# Patient Record
Sex: Male | Born: 1955
Health system: Southern US, Community
[De-identification: ages and names within clinical notes are randomized; demographics above are authoritative.]

## PROBLEM LIST (undated history)

## (undated) DIAGNOSIS — M1990 Unspecified osteoarthritis, unspecified site: Secondary | ICD-10-CM

## (undated) DIAGNOSIS — Z973 Presence of spectacles and contact lenses: Secondary | ICD-10-CM

## (undated) DIAGNOSIS — E119 Type 2 diabetes mellitus without complications: Secondary | ICD-10-CM

## (undated) DIAGNOSIS — K219 Gastro-esophageal reflux disease without esophagitis: Secondary | ICD-10-CM

## (undated) DIAGNOSIS — J3089 Other allergic rhinitis: Secondary | ICD-10-CM

## (undated) DIAGNOSIS — C61 Malignant neoplasm of prostate: Secondary | ICD-10-CM

## (undated) DIAGNOSIS — Z85820 Personal history of malignant melanoma of skin: Secondary | ICD-10-CM

## (undated) DIAGNOSIS — H409 Unspecified glaucoma: Secondary | ICD-10-CM

## (undated) DIAGNOSIS — F32A Depression, unspecified: Secondary | ICD-10-CM

## (undated) DIAGNOSIS — Z9889 Other specified postprocedural states: Secondary | ICD-10-CM

## (undated) DIAGNOSIS — Z9189 Other specified personal risk factors, not elsewhere classified: Secondary | ICD-10-CM

## (undated) DIAGNOSIS — F329 Major depressive disorder, single episode, unspecified: Secondary | ICD-10-CM

## (undated) DIAGNOSIS — I10 Essential (primary) hypertension: Secondary | ICD-10-CM

## (undated) HISTORY — PX: CARPAL TUNNEL RELEASE: SHX101

## (undated) HISTORY — DX: Unspecified osteoarthritis, unspecified site: M19.90

## (undated) HISTORY — DX: Major depressive disorder, single episode, unspecified: F32.9

## (undated) HISTORY — PX: PROSTATE BIOPSY: SHX241

## (undated) HISTORY — DX: Unspecified glaucoma: H40.9

## (undated) HISTORY — DX: Malignant neoplasm of prostate: C61

## (undated) HISTORY — DX: Depression, unspecified: F32.A

## (undated) HISTORY — PX: KNEE ARTHROSCOPY: SUR90

---

## 1989-08-21 HISTORY — PX: EYE SURGERY: SHX253

## 1998-09-14 ENCOUNTER — Ambulatory Visit (HOSPITAL_COMMUNITY): Admission: RE | Admit: 1998-09-14 | Discharge: 1998-09-14 | Payer: Self-pay | Admitting: Emergency Medicine

## 1999-09-09 ENCOUNTER — Encounter: Payer: Self-pay | Admitting: Emergency Medicine

## 1999-09-10 ENCOUNTER — Inpatient Hospital Stay (HOSPITAL_COMMUNITY): Admission: EM | Admit: 1999-09-10 | Discharge: 1999-09-12 | Payer: Self-pay | Admitting: Emergency Medicine

## 1999-09-10 ENCOUNTER — Encounter: Payer: Self-pay | Admitting: Emergency Medicine

## 1999-09-10 ENCOUNTER — Encounter: Payer: Self-pay | Admitting: *Deleted

## 1999-10-08 ENCOUNTER — Ambulatory Visit (HOSPITAL_COMMUNITY): Admission: RE | Admit: 1999-10-08 | Discharge: 1999-10-08 | Payer: Self-pay | Admitting: Gastroenterology

## 1999-10-08 ENCOUNTER — Encounter: Payer: Self-pay | Admitting: Gastroenterology

## 1999-10-22 ENCOUNTER — Ambulatory Visit (HOSPITAL_COMMUNITY): Admission: RE | Admit: 1999-10-22 | Discharge: 1999-10-22 | Payer: Self-pay | Admitting: Gastroenterology

## 1999-10-22 ENCOUNTER — Encounter: Payer: Self-pay | Admitting: Gastroenterology

## 2002-07-10 ENCOUNTER — Ambulatory Visit (HOSPITAL_COMMUNITY): Admission: RE | Admit: 2002-07-10 | Discharge: 2002-07-10 | Payer: Self-pay | Admitting: Gastroenterology

## 2002-07-10 ENCOUNTER — Encounter (INDEPENDENT_AMBULATORY_CARE_PROVIDER_SITE_OTHER): Payer: Self-pay | Admitting: *Deleted

## 2005-08-15 ENCOUNTER — Emergency Department (HOSPITAL_COMMUNITY): Admission: EM | Admit: 2005-08-15 | Discharge: 2005-08-15 | Payer: Self-pay | Admitting: Emergency Medicine

## 2005-09-17 ENCOUNTER — Emergency Department (HOSPITAL_COMMUNITY): Admission: EM | Admit: 2005-09-17 | Discharge: 2005-09-17 | Payer: Self-pay | Admitting: Emergency Medicine

## 2006-09-06 ENCOUNTER — Ambulatory Visit (HOSPITAL_BASED_OUTPATIENT_CLINIC_OR_DEPARTMENT_OTHER): Admission: RE | Admit: 2006-09-06 | Discharge: 2006-09-06 | Payer: Self-pay | Admitting: Orthopedic Surgery

## 2006-09-06 HISTORY — PX: OTHER SURGICAL HISTORY: SHX169

## 2008-02-08 ENCOUNTER — Emergency Department (HOSPITAL_COMMUNITY): Admission: EM | Admit: 2008-02-08 | Discharge: 2008-02-08 | Payer: Self-pay | Admitting: Emergency Medicine

## 2010-09-19 ENCOUNTER — Ambulatory Visit: Payer: Self-pay | Admitting: Genetic Counselor

## 2011-05-08 NOTE — Op Note (Signed)
Elijah Roberts, Elijah Roberts                ACCOUNT NO.:  [id number]   MEDICAL RECORD NO.:  [id number]          PATIENT TYPE:  AMB   LOCATION:  DSC                          FACILITY:  [facility]   PHYSICIAN:  Leonides Grills, M.D.     DATE OF BIRTH:  12-21-1956   DATE OF PROCEDURE:  09/06/2006  DATE OF DISCHARGE:                                 OPERATIVE REPORT   PREOPERATIVE DIAGNOSIS:  Right closed fifth metatarsal fracture.   POSTOPERATIVE DIAGNOSIS:  Right closed fifth metatarsal fracture.   OPERATION:  Open reduction internal fixation, right fifth metatarsal  fracture.   ANESTHESIA:  General.   SURGEON:  Leonides Grills, M.D.   ASSISTANT:  Evlyn Kanner, PA-C   ESTIMATED BLOOD LOSS:  Minimal.   TOURNIQUET TIME:  Approximately one-half hour.   COMPLICATIONS:  None.   DISPOSITION:  Stable to PR.   INDICATIONS FOR PROCEDURE:  This is a 55 year old male who sustained the  above injuries, consented to the above procedure.  All risks including  infection, neurovascular injury, nonunion, malunion, heart irritation, heart  failure, cock-up toe deformity of fifth toe secondary to painful scar  formation were all explained.  Questions were encouraged and answered.   OPERATION:  Patient brought to the operating room placed in the supine  position initially.  After adequate general endotracheal tube anesthesia was  administered as well as Ancef 1 g IV piggyback.  A bump was placed at the  ipsilateral hip, internally rotating the right lower extremity, and the  right lower extremity was prepped and draped in the sterile manor with  approximately 5 tourniquet limbs gravity exsanguinated and then tourniquet  was elevated to 290 mmHg.  A longitudinal incision over the dorsolateral  aspect of the right fifth metatarsal was then made.  Dissection was carried  down to bone.  Hemostasis was obtained.  Fracture was anatomically aligned  with the 2-point reduction clamp and 2 set screws were placed, one is  a 2.0-  mm screw with a 1.5-mm drill and the other screw is a 2.7-mm screw with a  2.0-mm drill hole.  This was counter sunk.  Both had excellent purchase and  maintenance of the correction.  Tourniquets were deflated.  Hemostasis was  obtained.  Stress x-rays were obtained, AP, lateral and oblique plains, that  showed no gross motion at the fracture site.  Fixation and proper position  and anatomical  alignment as well.  Hemostasis was obtained.  The wound was copiously  irrigated with normal saline.  Subcutaneous was closed with 3-0 Vicryl.  Skin was closed with 4-0 nylon.  Sterile dressing was applied.  A CAM Walker  boot was supplied to patient.  The patient was stable to the PR.      Leonides Grills, M.D.  Electronically Signed     PB/MEDQ  D:  09/06/2006  T:  09/07/2006  Job:  [id number]

## 2014-12-21 HISTORY — PX: CATARACT EXTRACTION W/ INTRAOCULAR LENS  IMPLANT, BILATERAL: SHX1307

## 2015-11-20 ENCOUNTER — Encounter: Payer: Self-pay | Admitting: Radiation Oncology

## 2015-11-20 DIAGNOSIS — C61 Malignant neoplasm of prostate: Secondary | ICD-10-CM

## 2015-11-20 NOTE — Progress Notes (Signed)
GU Location of Tumor / Histology: prostatic adenocarcinoma  If Prostate Cancer, Gleason Score is (3 + 4) and PSA is (4.32) on 09/18/2015  Celene Kras presented to Dr. Karsten Ro reporting his PSA was 3 several years ago.   Biopsies of prostate (if applicable) revealed:    Past/Anticipated interventions by urology, if any: biopsy and referral to Dr. Tammi Klippel to discuss radioactive seed implantation  Past/Anticipated interventions by medical oncology, if any: no  Weight changes, if any: no  Bowel/Bladder complaints, if any: Nocturia x 1 if at all. Reports nocturia is dependent solely on fluid intake. Reports occasional urinary frequency solely related to fluid intake as well.   Nausea/Vomiting, if any: no  Pain issues, if any:  Reports taking Motrin for "ambulance back"  SAFETY ISSUES:  Prior radiation? no  Pacemaker/ICD? no  Possible current pregnancy? no  Is the patient on methotrexate? 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

## 2015-11-21 ENCOUNTER — Ambulatory Visit
Admission: RE | Admit: 2015-11-21 | Discharge: 2015-11-21 | Disposition: A | Discharge status: Home / Self Care | Payer: 59 | Source: Ambulatory Visit | Attending: Radiation Oncology | Admitting: Radiation Oncology

## 2015-11-21 ENCOUNTER — Encounter: Payer: Self-pay | Admitting: Radiation Oncology

## 2015-11-21 VITALS — BP 147/91 | HR 79 | Resp 16 | Ht 73.0 in | Wt 263.4 lb

## 2015-11-21 DIAGNOSIS — C61 Malignant neoplasm of prostate: Secondary | ICD-10-CM | POA: Insufficient documentation

## 2015-11-21 DIAGNOSIS — Z510 Encounter for antineoplastic radiation therapy: Secondary | ICD-10-CM | POA: Insufficient documentation

## 2015-11-21 NOTE — Progress Notes (Signed)
See progress note under physician encounter. 

## 2015-11-21 NOTE — Progress Notes (Signed)
Radiation Oncology         [phone]) [phone] ________________________________  Initial outpatient Consultation  Name: Elijah Roberts MRN: [id number]  Date: 11/21/2015  DOB: Jul 27, 1956  CC:No primary care provider on file.  Kathie Rhodes, MD   REFERRING PHYSICIAN: Kathie Rhodes, MD  DIAGNOSIS: 59 y.o. gentleman with stage T1c adenocarcinoma of the prostate with a Gleason's score of 3+4 and a PSA of 4.32    ICD-9-CM ICD-10-CM   1. Malignant neoplasm of prostate ([place]) [place] [facility] referral to [place] is a 59 y.o. gentleman.  He was noted to have an elevated PSA of 4.32 by his primary care physician, Dr. Bing Matter.  Accordingly, he was referred for evaluation in urology by Dr. Karsten Ro on 09/25/15,  digital rectal examination was performed at that time revealing no nodules.  The patient proceeded to transrectal ultrasound with 12 biopsies of the prostate on 10/25/15.  The prostate volume measured 30.5 cc.  Out of 12 core biopsies,2 were positive.  The maximum Gleason score was 3+4, and this was seen in left lateral mid.  The patient reviewed the biopsy results with his urologist and he has kindly been referred today for discussion of potential radiation treatment options.    Marland Kitchen  PREVIOUS RADIATION THERAPY: No  PAST MEDICAL HISTORY:  has a past medical history of Prostate cancer ([place]); Arthritis; Depression; Glaucoma; and Skin cancer.    PAST SURGICAL HISTORY: Past Surgical History  Procedure Laterality Date  . Knee surgery    . Knee surgery    . Wrist surgery    . Prostate biopsy      FAMILY HISTORY: family history includes Cancer in his maternal uncle.  SOCIAL HISTORY:  reports that he has never smoked. He has never used smokeless tobacco. He reports that he does not drink alcohol or use illicit drugs.  ALLERGIES: Altace; Bee venom; Other; and Tetanus toxoids  MEDICATIONS:  Current Outpatient Prescriptions  Medication Sig  Dispense Refill  . aspirin 81 MG tablet Take 81 mg by mouth daily.    . bimatoprost (LUMIGAN) 0.03 % ophthalmic solution 1 drop at bedtime.    . dorzolamide-timolol (COSOPT) 22.3-6.8 MG/ML ophthalmic solution 1 drop 2 (two) times daily.    Marland Kitchen EPIPEN 2-PAK 0.3 MG/0.3ML SOAJ injection   11  . ibuprofen (ADVIL,MOTRIN) 200 MG tablet Take 200 mg by mouth every 6 (six) hours as needed.    . metFORMIN (GLUCOPHAGE) 500 MG tablet Take by mouth 2 (two) times daily with a meal.     No current facility-administered medications for this encounter.    REVIEW OF SYSTEMS:  A 15 point review of systems is documented in the electronic medical record. This was obtained by the nursing staff. However, I reviewed this with the patient to discuss relevant findings and make appropriate changes.  A comprehensive review of systems was negative..  The patient completed an IPSS and IIEF questionnaire.  His IPSS score was 2 indicating mild urinary outflow obstructive symptoms.  He indicated that his erectile function is able to complete sexual activity almost always.   PHYSICAL EXAM: This patient is in no acute distress.  He is alert and oriented.   height is 6\' 1"  (1.854 m) and weight is 263 lb 6.4 oz (119.477 kg). His blood pressure is 147/91 and his pulse is 79. His respiration is 16 and oxygen saturation is 100%.  He exhibits no respiratory distress or labored breathing.  He  appears neurologically intact.  His mood is pleasant.  His affect is appropriate.  Please note the digital rectal exam findings described above.  KPS = 100  100 - Normal; no complaints; no evidence of disease. 90   - Able to carry on normal activity; minor signs or symptoms of disease. 80   - Normal activity with effort; some signs or symptoms of disease. 97   - Cares for self; unable to carry on normal activity or to do active work. 60   - Requires occasional assistance, but is able to care for most of his personal needs. 50   - Requires  considerable assistance and frequent medical care. 56   - Disabled; requires special care and assistance. 7   - Severely disabled; hospital admission is indicated although death not imminent. 57   - Very sick; hospital admission necessary; active supportive treatment necessary. 10   - Moribund; fatal processes progressing rapidly. 0     - Dead  Karnofsky DA, Abelmann WH, Craver LS and Burchenal JH [phone]) The use of the nitrogen mustards in the palliative treatment of carcinoma: with particular reference to bronchogenic carcinoma Cancer 1 634-56   LABORATORY DATA:  No results found for: WBC, HGB, HCT, MCV, PLT No results found for: NA, K, CL, CO2 No results found for: ALT, AST, GGT, ALKPHOS, BILITOT   RADIOGRAPHY: No results found.    IMPRESSION: This gentleman is a 59 y.o. gentleman with stage T1c adenocarcinoma of the prostate with a Gleason's score of 3+4 and a PSA of 4.32.  His T-Stage, Gleason's Score, and PSA put him into the intermediate risk group.  He falls into a select sub-set of patients with intermediate risk disease who are eligible for seed implant with primary Gleason grade of 3 and less than half of one lobe positive for Gleason's 7 disease.  Accordingly he is eligible for a variety of potential treatment options including seed implant.  PLAN:Today I reviewed the findings and workup thus far.  We discussed the natural history of prostate cancer.  We reviewed the the implications of T-stage, Gleason's Score, and PSA on decision-making and outcomes in prostate cancer.  We discussed radiation treatment in the management of prostate cancer with regard to the logistics and delivery of external beam radiation treatment as well as the logistics and delivery of prostate brachytherapy.  We compared and contrasted each of these approaches and also compared these against prostatectomy.  The patient expressed interest in prostate brachytherapy.  I filled out a patient counseling form for him  with relevant treatment diagrams and we retained a copy for our records.   The patient would like to proceed with prostate brachytherapy.  I will share my findings with Dr. Karsten Ro and move forward with scheduling the procedure in the near future.     I enjoyed meeting with him today, and will look forward to participating in the care of this very nice gentleman.  I spent 55 minutes face to face with the patient and more than 50% of that time was spent in counseling and/or coordination of care.   ------------------------------------------------  Sheral Apley. Tammi Klippel, M.D.      This document serves as a record of services personally performed by Tyler Pita, MD. It was created on his behalf by Lendon Collar, a trained medical scribe. The creation of this record is based on the scribe's personal observations and the provider's statements to them. This document has been checked and approved by the attending provider.

## 2015-11-26 ENCOUNTER — Telehealth: Payer: Self-pay | Admitting: *Deleted

## 2015-11-26 ENCOUNTER — Other Ambulatory Visit: Payer: Self-pay | Admitting: Urology

## 2015-11-26 NOTE — Telephone Encounter (Signed)
CALLED PATIENT TO INFORM OF IMPLANT FOR 01-16-15, SPOKE WITH PATIENT AND HE IS AWARE OF THIS IMPLANT

## 2015-11-29 ENCOUNTER — Encounter: Payer: Self-pay | Admitting: *Deleted

## 2015-11-29 NOTE — Progress Notes (Signed)
[place] Psychosocial Distress Screening Clinical Social Work  Clinical Social Work was referred by distress screening protocol.  The patient scored a 7 on the Psychosocial Distress Thermometer which indicates severe distress. Clinical Social Worker phoned pt and reviewed chart to assess for distress and other psychosocial needs. Pt reports his current distress is not high and it was all related to work at that time as they were understaffed. Pt denies concerns about cancer treatment plan and was open to Pt and Family Support Team options and may attend future Prostate Cancer Support Groups. Pt agrees to reach out to [place] team as needs arise.   ONCBCN DISTRESS SCREENING 11/21/2015  Screening Type Initial Screening  Distress experienced in past week (1-10) 7  Practical problem type Work/school  Physician notified of physical symptoms Yes  Referral to clinical psychology No  Referral to clinical social work Yes  Referral to dietition No  Referral to financial advocate No  Referral to support programs No  Referral to palliative care No  Other [phone]    Clinical Social Worker follow up needed: No.  If yes, follow up plan: Loren Racer, [place]  [facility] Phone: [phone] Fax: [phone]

## 2015-12-02 ENCOUNTER — Ambulatory Visit: Payer: 59 | Admitting: Radiation Oncology

## 2015-12-02 ENCOUNTER — Ambulatory Visit: Admission: RE | Admit: 2015-12-02 | Payer: 59 | Source: Ambulatory Visit

## 2015-12-05 ENCOUNTER — Telehealth: Payer: Self-pay | Admitting: *Deleted

## 2015-12-05 NOTE — Telephone Encounter (Signed)
Called patient to remind of pre-seed appt. For 12-06-15, spoke with patient and he is aware of this appt.

## 2015-12-06 ENCOUNTER — Ambulatory Visit
Admission: RE | Admit: 2015-12-06 | Discharge: 2015-12-06 | Disposition: A | Discharge status: Home / Self Care | Payer: 59 | Source: Ambulatory Visit | Attending: Radiation Oncology | Admitting: Radiation Oncology

## 2015-12-06 ENCOUNTER — Ambulatory Visit (HOSPITAL_COMMUNITY)
Admission: RE | Admit: 2015-12-06 | Discharge: 2015-12-06 | Disposition: A | Discharge status: Home / Self Care | Payer: 59 | Source: Ambulatory Visit | Attending: Urology | Admitting: Urology

## 2015-12-06 ENCOUNTER — Ambulatory Visit (HOSPITAL_BASED_OUTPATIENT_CLINIC_OR_DEPARTMENT_OTHER)
Admission: RE | Admit: 2015-12-06 | Discharge: 2015-12-06 | Disposition: A | Discharge status: Home / Self Care | Payer: 59 | Source: Ambulatory Visit | Attending: Radiation Oncology | Admitting: Radiation Oncology

## 2015-12-06 ENCOUNTER — Encounter (HOSPITAL_BASED_OUTPATIENT_CLINIC_OR_DEPARTMENT_OTHER)
Admission: RE | Admit: 2015-12-06 | Discharge: 2015-12-06 | Disposition: A | Discharge status: Home / Self Care | Payer: 59 | Source: Ambulatory Visit | Attending: Urology | Admitting: Urology

## 2015-12-06 ENCOUNTER — Other Ambulatory Visit: Payer: Self-pay

## 2015-12-06 DIAGNOSIS — R918 Other nonspecific abnormal finding of lung field: Secondary | ICD-10-CM | POA: Insufficient documentation

## 2015-12-06 DIAGNOSIS — C61 Malignant neoplasm of prostate: Secondary | ICD-10-CM

## 2015-12-06 DIAGNOSIS — Z510 Encounter for antineoplastic radiation therapy: Secondary | ICD-10-CM | POA: Diagnosis not present

## 2015-12-06 DIAGNOSIS — Z01818 Encounter for other preprocedural examination: Secondary | ICD-10-CM | POA: Diagnosis not present

## 2015-12-06 NOTE — Progress Notes (Signed)
  Radiation Oncology         [phone]) [phone] ________________________________  Name: Elijah Roberts MRN: [id number]  Date: 12/06/2015  DOB: Apr 07, 1956  SIMULATION AND TREATMENT PLANNING NOTE PUBIC ARCH STUDY  CC:No primary care provider on file.  Kathie Rhodes, MD  DIAGNOSIS: 59 y.o. gentleman with stage T1c adenocarcinoma of the prostate with a Gleason's score of 3+4 and a PSA of 4.32     ICD-9-CM ICD-10-CM   1. Malignant neoplasm of prostate ([place]) [place]:  The patient presented today for evaluation for possible prostate seed implant. He was brought to the radiation planning suite and placed supine on the CT couch. A 3-dimensional image study set was obtained in upload to the planning computer. There, on each axial slice, I contoured the prostate gland. Then, using three-dimensional radiation planning tools I reconstructed the prostate in view of the structures from the transperineal needle pathway to assess for possible pubic arch interference. In doing so, I did not appreciate any pubic arch interference. Also, the patient's prostate volume was estimated based on the drawn structure. The volume was 36 cc.  Given the pubic arch appearance and prostate volume, patient remains a good candidate to proceed with prostate seed implant. Today, he freely provided informed written consent to proceed.    PLAN: The patient will undergo prostate seed implant.   ________________________________  Sheral Apley. Tammi Klippel, M.D.     This document serves as a record of services personally performed by Tyler Pita, MD. It was created on his behalf by Lendon Collar, a trained medical scribe. The creation of this record is based on the scribe's personal observations and the provider's statements to them. This document has been checked and approved by the attending provider.

## 2015-12-09 ENCOUNTER — Other Ambulatory Visit (HOSPITAL_COMMUNITY): Payer: Self-pay | Admitting: Urology

## 2015-12-09 DIAGNOSIS — C61 Malignant neoplasm of prostate: Secondary | ICD-10-CM

## 2015-12-12 ENCOUNTER — Encounter (HOSPITAL_COMMUNITY)
Admission: RE | Admit: 2015-12-12 | Discharge: 2015-12-12 | Disposition: A | Discharge status: Home / Self Care | Payer: 59 | Source: Ambulatory Visit | Attending: Urology | Admitting: Urology

## 2015-12-12 DIAGNOSIS — M899 Disorder of bone, unspecified: Secondary | ICD-10-CM | POA: Insufficient documentation

## 2015-12-12 DIAGNOSIS — C61 Malignant neoplasm of prostate: Secondary | ICD-10-CM

## 2015-12-12 MED ORDER — TECHNETIUM TC 99M MEDRONATE IV KIT
26.3000 | PACK | Freq: Once | INTRAVENOUS | Status: AC | PRN
  Administered 2015-12-12: 26.3 via INTRAVENOUS

## 2015-12-24 MED FILL — DORZOLAMIDE-TIMOLOL EYE DRP: 22.3-6.8 | 50 days supply | Qty: 10 | Fill #7

## 2016-01-08 ENCOUNTER — Encounter (HOSPITAL_BASED_OUTPATIENT_CLINIC_OR_DEPARTMENT_OTHER): Payer: Self-pay | Admitting: *Deleted

## 2016-01-09 ENCOUNTER — Telehealth: Payer: Self-pay | Admitting: *Deleted

## 2016-01-09 ENCOUNTER — Encounter (HOSPITAL_BASED_OUTPATIENT_CLINIC_OR_DEPARTMENT_OTHER): Payer: Self-pay | Admitting: *Deleted

## 2016-01-09 NOTE — Telephone Encounter (Signed)
CALLED PATIENT TO REMIND OF LABS FOR 01-10-16 FOR IMPLANT ON 01-17-16, SPOKE WITH PATIENT AND HE IS AWARE OF THESE LABS

## 2016-01-09 NOTE — Progress Notes (Signed)
NPO AFTER MN.  ARRIVE AT 0800.  GETTING LAB WORK DONE FRI. 01-10-2016.  CURRENT EKG AND CXR IN CHART AND EPIC.  WILL TAKE NORVASC AM DOS W/ SIPS OF WATER AND DO FLEET ENEMA.

## 2016-01-09 NOTE — Progress Notes (Signed)
   01/09/16 1038  OBSTRUCTIVE SLEEP APNEA  Have you ever been diagnosed with sleep apnea through a sleep study? No  Do you snore loudly (loud enough to be heard through closed doors)?  1  Do you often feel tired, fatigued, or sleepy during the daytime (such as falling asleep during driving or talking to someone)? 0  Has anyone observed you stop breathing during your sleep? 1  Do you have, or are you being treated for high blood pressure? 1  BMI more than 35 kg/m2? 1  Age > 50 (1-yes) 1  Neck circumference greater than:Male 16 inches or larger, Male 17inches or larger? 1  Male Gender (Yes=1) 1  Obstructive Sleep Apnea Score 7  Score 5 or greater  Results sent to PCP

## 2016-01-10 DIAGNOSIS — C61 Malignant neoplasm of prostate: Secondary | ICD-10-CM | POA: Diagnosis not present

## 2016-01-10 DIAGNOSIS — Z85828 Personal history of other malignant neoplasm of skin: Secondary | ICD-10-CM | POA: Diagnosis not present

## 2016-01-10 DIAGNOSIS — M1990 Unspecified osteoarthritis, unspecified site: Secondary | ICD-10-CM | POA: Diagnosis not present

## 2016-01-10 DIAGNOSIS — Z7982 Long term (current) use of aspirin: Secondary | ICD-10-CM | POA: Diagnosis not present

## 2016-01-10 DIAGNOSIS — Z7984 Long term (current) use of oral hypoglycemic drugs: Secondary | ICD-10-CM | POA: Diagnosis not present

## 2016-01-10 DIAGNOSIS — K219 Gastro-esophageal reflux disease without esophagitis: Secondary | ICD-10-CM | POA: Diagnosis not present

## 2016-01-10 DIAGNOSIS — H409 Unspecified glaucoma: Secondary | ICD-10-CM | POA: Diagnosis not present

## 2016-01-10 DIAGNOSIS — I10 Essential (primary) hypertension: Secondary | ICD-10-CM | POA: Diagnosis not present

## 2016-01-10 DIAGNOSIS — Z8042 Family history of malignant neoplasm of prostate: Secondary | ICD-10-CM | POA: Diagnosis not present

## 2016-01-10 DIAGNOSIS — E119 Type 2 diabetes mellitus without complications: Secondary | ICD-10-CM | POA: Diagnosis not present

## 2016-01-10 DIAGNOSIS — Z79899 Other long term (current) drug therapy: Secondary | ICD-10-CM | POA: Diagnosis not present

## 2016-01-10 LAB — COMPREHENSIVE METABOLIC PANEL
ALT: 51 U/L (ref 17–63)
AST: 33 U/L (ref 15–41)
Albumin: 4.2 g/dL (ref 3.5–5.0)
Alkaline Phosphatase: 87 U/L (ref 38–126)
Anion gap: 11 (ref 5–15)
BUN: 20 mg/dL (ref 6–20)
CO2: 24 mmol/L (ref 22–32)
Calcium: 9.7 mg/dL (ref 8.9–10.3)
Chloride: 106 mmol/L (ref 101–111)
Creatinine, Ser: 1.04 mg/dL (ref 0.61–1.24)
GFR calc Af Amer: >60 mL/min (ref >60)
GFR calc non Af Amer: >60 mL/min (ref >60)
Glucose, Bld: 223 mg/dL — ABNORMAL HIGH (ref 65–99)
Potassium: 4.1 mmol/L (ref 3.5–5.1)
Sodium: 141 mmol/L (ref 135–145)
Total Bilirubin: 1 mg/dL (ref 0.3–1.2)
Total Protein: 6.8 g/dL (ref 6.5–8.1)

## 2016-01-10 LAB — CBC
HCT: 46.1 % (ref 39.0–52.0)
Hemoglobin: 16 g/dL (ref 13.0–17.0)
MCH: 29 pg (ref 26.0–34.0)
MCHC: 34.7 g/dL (ref 30.0–36.0)
MCV: 83.5 fL (ref 78.0–100.0)
Platelets: 203 10*3/uL (ref 150–400)
RBC: 5.52 MIL/uL (ref 4.22–5.81)
RDW: 12.9 % (ref 11.5–15.5)
WBC: 7 10*3/uL (ref 4.0–10.5)

## 2016-01-10 LAB — APTT: aPTT: 28 seconds (ref 24–37)

## 2016-01-10 LAB — PROTIME-INR
INR: 1.02 (ref 0.00–1.49)
Prothrombin Time: 13.6 seconds (ref 11.6–15.2)

## 2016-01-16 ENCOUNTER — Telehealth: Payer: Self-pay | Admitting: *Deleted

## 2016-01-16 DIAGNOSIS — C61 Malignant neoplasm of prostate: Secondary | ICD-10-CM | POA: Diagnosis not present

## 2016-01-16 NOTE — Telephone Encounter (Signed)
CALLED PATIENT TO REMIND OF IMPLANT FOR 01-17-16, SPOKE WITH PATIENT AND HE IS AWARE OF THIS IMPLANT

## 2016-01-17 ENCOUNTER — Ambulatory Visit (HOSPITAL_COMMUNITY): Payer: 59

## 2016-01-17 ENCOUNTER — Ambulatory Visit (HOSPITAL_BASED_OUTPATIENT_CLINIC_OR_DEPARTMENT_OTHER): Payer: 59 | Admitting: Anesthesiology

## 2016-01-17 ENCOUNTER — Ambulatory Visit (HOSPITAL_BASED_OUTPATIENT_CLINIC_OR_DEPARTMENT_OTHER)
Admission: RE | Admit: 2016-01-17 | Discharge: 2016-01-17 | Disposition: A | Discharge status: Home / Self Care | Payer: 59 | Source: Ambulatory Visit | Attending: Urology | Admitting: Urology

## 2016-01-17 ENCOUNTER — Encounter (HOSPITAL_BASED_OUTPATIENT_CLINIC_OR_DEPARTMENT_OTHER)
Admission: RE | Disposition: A | Discharge status: Home / Self Care | Payer: Self-pay | Source: Ambulatory Visit | Attending: Urology

## 2016-01-17 ENCOUNTER — Encounter (HOSPITAL_BASED_OUTPATIENT_CLINIC_OR_DEPARTMENT_OTHER): Payer: Self-pay | Admitting: Anesthesiology

## 2016-01-17 DIAGNOSIS — Z8042 Family history of malignant neoplasm of prostate: Secondary | ICD-10-CM | POA: Insufficient documentation

## 2016-01-17 DIAGNOSIS — H409 Unspecified glaucoma: Secondary | ICD-10-CM | POA: Insufficient documentation

## 2016-01-17 DIAGNOSIS — Z79899 Other long term (current) drug therapy: Secondary | ICD-10-CM | POA: Insufficient documentation

## 2016-01-17 DIAGNOSIS — Z7984 Long term (current) use of oral hypoglycemic drugs: Secondary | ICD-10-CM | POA: Diagnosis not present

## 2016-01-17 DIAGNOSIS — K219 Gastro-esophageal reflux disease without esophagitis: Secondary | ICD-10-CM | POA: Insufficient documentation

## 2016-01-17 DIAGNOSIS — M1990 Unspecified osteoarthritis, unspecified site: Secondary | ICD-10-CM | POA: Insufficient documentation

## 2016-01-17 DIAGNOSIS — I10 Essential (primary) hypertension: Secondary | ICD-10-CM | POA: Diagnosis not present

## 2016-01-17 DIAGNOSIS — Z7982 Long term (current) use of aspirin: Secondary | ICD-10-CM | POA: Diagnosis not present

## 2016-01-17 DIAGNOSIS — E119 Type 2 diabetes mellitus without complications: Secondary | ICD-10-CM | POA: Diagnosis not present

## 2016-01-17 DIAGNOSIS — Z85828 Personal history of other malignant neoplasm of skin: Secondary | ICD-10-CM | POA: Insufficient documentation

## 2016-01-17 DIAGNOSIS — C61 Malignant neoplasm of prostate: Secondary | ICD-10-CM | POA: Insufficient documentation

## 2016-01-17 HISTORY — DX: Presence of spectacles and contact lenses: Z97.3

## 2016-01-17 HISTORY — DX: Personal history of malignant melanoma of skin: Z85.820

## 2016-01-17 HISTORY — PX: RADIOACTIVE SEED IMPLANT: SHX5150

## 2016-01-17 HISTORY — DX: Gastro-esophageal reflux disease without esophagitis: K21.9

## 2016-01-17 HISTORY — DX: Essential (primary) hypertension: I10

## 2016-01-17 HISTORY — DX: Type 2 diabetes mellitus without complications: E11.9

## 2016-01-17 HISTORY — DX: Other allergic rhinitis: J30.89

## 2016-01-17 HISTORY — DX: Other specified personal risk factors, not elsewhere classified: Z91.89

## 2016-01-17 HISTORY — DX: Other specified postprocedural states: Z98.890

## 2016-01-17 LAB — GLUCOSE, CAPILLARY
Glucose-Capillary: 167 mg/dL — ABNORMAL HIGH (ref 65–99)
Glucose-Capillary: 181 mg/dL — ABNORMAL HIGH (ref 65–99)

## 2016-01-17 SURGERY — INSERTION, RADIATION SOURCE, PROSTATE
Anesthesia: General | Site: Prostate

## 2016-01-17 MED ORDER — PROMETHAZINE HCL 25 MG/ML IJ SOLN
6.2500 mg | INTRAMUSCULAR | Status: DC | PRN
  Filled 2016-01-17: qty 1

## 2016-01-17 MED ORDER — MIDAZOLAM HCL 5 MG/5ML IJ SOLN
INTRAMUSCULAR | Status: DC | PRN
  Administered 2016-01-17: 2 mg via INTRAVENOUS

## 2016-01-17 MED ORDER — FLEET ENEMA 7-19 GM/118ML RE ENEM
1.0000 | ENEMA | Freq: Once | RECTAL | Status: DC
  Filled 2016-01-17: qty 1

## 2016-01-17 MED ORDER — ONDANSETRON HCL 4 MG/2ML IJ SOLN
INTRAMUSCULAR | Status: AC
  Filled 2016-01-17: qty 2

## 2016-01-17 MED ORDER — LACTATED RINGERS IV SOLN
INTRAVENOUS | Status: DC
  Administered 2016-01-17 (×2): via INTRAVENOUS
  Filled 2016-01-17: qty 1000

## 2016-01-17 MED ORDER — LIDOCAINE HCL (CARDIAC) 20 MG/ML IV SOLN
INTRAVENOUS | Status: AC
  Filled 2016-01-17: qty 5

## 2016-01-17 MED ORDER — DEXAMETHASONE SODIUM PHOSPHATE 10 MG/ML IJ SOLN
INTRAMUSCULAR | Status: AC
  Filled 2016-01-17: qty 1

## 2016-01-17 MED ORDER — HYDROMORPHONE HCL 1 MG/ML IJ SOLN
0.2500 mg | INTRAMUSCULAR | Status: DC | PRN
  Filled 2016-01-17: qty 1

## 2016-01-17 MED ORDER — CIPROFLOXACIN IN D5W 400 MG/200ML IV SOLN
INTRAVENOUS | Status: AC
  Filled 2016-01-17: qty 200

## 2016-01-17 MED ORDER — STERILE WATER FOR IRRIGATION IR SOLN
Status: DC | PRN
  Administered 2016-01-17: 3 mL via INTRAVESICAL
  Administered 2016-01-17: 3000 mL via INTRAVESICAL

## 2016-01-17 MED ORDER — HYDROCODONE-ACETAMINOPHEN 7.5-325 MG PO TABS: 1.0000 | ORAL_TABLET | ORAL | Status: AC | PRN

## 2016-01-17 MED ORDER — EPHEDRINE SULFATE 50 MG/ML IJ SOLN
INTRAMUSCULAR | Status: DC | PRN
  Administered 2016-01-17 (×3): 10 mg via INTRAVENOUS

## 2016-01-17 MED ORDER — FENTANYL CITRATE (PF) 100 MCG/2ML IJ SOLN
INTRAMUSCULAR | Status: AC
  Filled 2016-01-17: qty 2

## 2016-01-17 MED ORDER — MIDAZOLAM HCL 2 MG/2ML IJ SOLN
INTRAMUSCULAR | Status: AC
  Filled 2016-01-17: qty 2

## 2016-01-17 MED ORDER — PROPOFOL 10 MG/ML IV BOLUS
INTRAVENOUS | Status: DC | PRN
  Administered 2016-01-17: 250 mg via INTRAVENOUS
  Administered 2016-01-17 (×2): 20 mg via INTRAVENOUS
  Administered 2016-01-17: 10 mg via INTRAVENOUS

## 2016-01-17 MED ORDER — ACETAMINOPHEN 10 MG/ML IV SOLN
INTRAVENOUS | Status: DC | PRN
  Administered 2016-01-17: 1000 mg via INTRAVENOUS

## 2016-01-17 MED ORDER — DEXAMETHASONE SODIUM PHOSPHATE 4 MG/ML IJ SOLN
INTRAMUSCULAR | Status: DC | PRN
  Administered 2016-01-17: 10 mg via INTRAVENOUS

## 2016-01-17 MED ORDER — EPHEDRINE SULFATE 50 MG/ML IJ SOLN
INTRAMUSCULAR | Status: AC
  Filled 2016-01-17: qty 1

## 2016-01-17 MED ORDER — CIPROFLOXACIN IN D5W 400 MG/200ML IV SOLN
400.0000 mg | INTRAVENOUS | Status: AC
  Administered 2016-01-17: 400 mg via INTRAVENOUS
  Filled 2016-01-17: qty 200

## 2016-01-17 MED ORDER — IOHEXOL 350 MG/ML SOLN
INTRAVENOUS | Status: DC | PRN
  Administered 2016-01-17: 7 mL

## 2016-01-17 MED ORDER — ACETAMINOPHEN 10 MG/ML IV SOLN
INTRAVENOUS | Status: AC
  Filled 2016-01-17: qty 100

## 2016-01-17 MED ORDER — FENTANYL CITRATE (PF) 100 MCG/2ML IJ SOLN
INTRAMUSCULAR | Status: DC | PRN
  Administered 2016-01-17 (×3): 25 ug via INTRAVENOUS
  Administered 2016-01-17: 100 ug via INTRAVENOUS
  Administered 2016-01-17: 25 ug via INTRAVENOUS

## 2016-01-17 MED ORDER — CIPROFLOXACIN HCL 500 MG PO TABS: 500.0000 mg | ORAL_TABLET | Freq: Two times a day (BID) | ORAL | Status: DC

## 2016-01-17 MED ORDER — ARTIFICIAL TEARS OP OINT
TOPICAL_OINTMENT | OPHTHALMIC | Status: AC
  Filled 2016-01-17: qty 3.5

## 2016-01-17 MED ORDER — LIDOCAINE HCL (CARDIAC) 20 MG/ML IV SOLN
INTRAVENOUS | Status: DC | PRN
  Administered 2016-01-17: 100 mg via INTRAVENOUS

## 2016-01-17 MED ORDER — ONDANSETRON HCL 4 MG/2ML IJ SOLN
INTRAMUSCULAR | Status: DC | PRN
  Administered 2016-01-17: 4 mg via INTRAVENOUS

## 2016-01-17 MED FILL — HYDROCODON-APAP 7.5-325: 7.5-325 | 3 days supply | Qty: 20 | Fill #0

## 2016-01-17 MED FILL — CIPROFLOXACIN HCL 500 MG TA: 500 | 3 days supply | Qty: 6 | Fill #0

## 2016-01-17 SURGICAL SUPPLY — 28 items
BAG URINE DRAINAGE (UROLOGICAL SUPPLIES) ×3 IMPLANT
BLADE CLIPPER SURG (BLADE) ×2 IMPLANT
CATH FOLEY 2WAY SLVR  5CC 16FR (CATHETERS) ×2
CATH FOLEY 2WAY SLVR 5CC 16FR (CATHETERS) ×2 IMPLANT
CATH ROBINSON RED A/P 20FR (CATHETERS) ×2 IMPLANT
CLOTH BEACON ORANGE TIMEOUT ST (SAFETY) ×2 IMPLANT
COVER BACK TABLE 60X90IN (DRAPES) ×2 IMPLANT
COVER MAYO STAND STRL (DRAPES) ×2 IMPLANT
DRSG TEGADERM 4X4.75 (GAUZE/BANDAGES/DRESSINGS) ×2 IMPLANT
DRSG TEGADERM 8X12 (GAUZE/BANDAGES/DRESSINGS) ×2 IMPLANT
GLOVE BIO SURGEON STRL SZ8 (GLOVE) ×4 IMPLANT
GLOVE ECLIPSE 8.0 STRL XLNG CF (GLOVE) ×4 IMPLANT
GLOVE INDICATOR 7.5 STRL GRN (GLOVE) ×6 IMPLANT
GOWN STRL REUS W/ TWL XL LVL3 (GOWN DISPOSABLE) ×1 IMPLANT
GOWN STRL REUS W/TWL XL LVL3 (GOWN DISPOSABLE) ×2
GOWN XL W/COTTON TOWEL STD (GOWNS) ×2 IMPLANT
HOLDER FOLEY CATH W/STRAP (MISCELLANEOUS) ×2 IMPLANT
IV NS IRRIG 3000ML ARTHROMATIC (IV SOLUTION) IMPLANT
KIT ROOM TURNOVER WOR (KITS) ×2 IMPLANT
MANIFOLD NEPTUNE II (INSTRUMENTS) IMPLANT
PACK CYSTO (CUSTOM PROCEDURE TRAY) ×2 IMPLANT
SPONGE GAUZE 4X4 12PLY STER LF (GAUZE/BANDAGES/DRESSINGS) ×1 IMPLANT
SYRINGE 10CC LL (SYRINGE) ×3 IMPLANT
TUBE CONNECTING 12X1/4 (SUCTIONS) IMPLANT
UNDERPAD 30X30 INCONTINENT (UNDERPADS AND DIAPERS) ×4 IMPLANT
WATER STERILE IRR 3000ML UROMA (IV SOLUTION) ×1 IMPLANT
WATER STERILE IRR 500ML POUR (IV SOLUTION) ×2 IMPLANT
seed ×83 IMPLANT

## 2016-01-17 NOTE — Transfer of Care (Signed)
Last Vitals:  Filed Vitals:   01/17/16 0759  BP: 150/95  Pulse: 77  Temp: 36.9 C  Resp: 16    Immediate Anesthesia Transfer of Care Note  Patient: Elijah Roberts  Procedure(s) Performed: Procedure(s) (LRB): RADIOACTIVE SEED IMPLANT/BRACHYTHERAPY IMPLANT (N/A)  Patient Location: PACU  Anesthesia Type: General  Level of Consciousness: awake, alert  and oriented  Airway & Oxygen Therapy: Patient Spontanous Breathing and Patient connected to nasal canula oxygen  Post-op Assessment: Report given to PACU RN and Post -op Vital signs reviewed and stable  Post vital signs: Reviewed and stable  Complications: No apparent anesthesia complications

## 2016-01-17 NOTE — Anesthesia Preprocedure Evaluation (Addendum)
Anesthesia Evaluation  Patient identified by MRN, date of birth, ID band Patient awake    Reviewed: Allergy & Precautions, NPO status , Patient's Chart, lab work & pertinent test results  Airway Mallampati: II  TM Distance: >3 FB Neck ROM: Full    Dental  (+) Teeth Intact   Pulmonary neg pulmonary ROS,    breath sounds clear to auscultation       Cardiovascular hypertension,  Rhythm:Regular Rate:Normal     Neuro/Psych    GI/Hepatic GERD  ,  Endo/Other  diabetes, Oral Hypoglycemic Agents  Renal/GU Prostate ca     Musculoskeletal  (+) Arthritis ,   Abdominal   Peds  Hematology   Anesthesia Other Findings   Reproductive/Obstetrics                            Anesthesia Physical Anesthesia Plan  ASA: III  Anesthesia Plan: General   Post-op Pain Management:    Induction: Intravenous  Airway Management Planned: LMA  Additional Equipment:   Intra-op Plan:   Post-operative Plan: Extubation in OR  Informed Consent: I have reviewed the patients History and Physical, chart, labs and discussed the procedure including the risks, benefits and alternatives for the proposed anesthesia with the patient or authorized representative who has indicated his/her understanding and acceptance.   Dental advisory given  Plan Discussed with: CRNA and Surgeon  Anesthesia Plan Comments:         Anesthesia Quick Evaluation

## 2016-01-17 NOTE — Anesthesia Postprocedure Evaluation (Signed)
Anesthesia Post Note  Patient: Elijah Roberts  Procedure(s) Performed: Procedure(s) (LRB): RADIOACTIVE SEED IMPLANT/BRACHYTHERAPY IMPLANT (N/A)  Patient location during evaluation: PACU Anesthesia Type: General Level of consciousness: awake and alert Pain management: pain level controlled Vital Signs Assessment: post-procedure vital signs reviewed and stable Respiratory status: spontaneous breathing, nonlabored ventilation, respiratory function stable and patient connected to nasal cannula oxygen Cardiovascular status: blood pressure returned to baseline and stable Postop Assessment: no signs of nausea or vomiting Anesthetic complications: no    Last Vitals:  Filed Vitals:   01/17/16 1145 01/17/16 1200  BP: 146/91 133/80  Pulse: 86 82  Temp:    Resp: 14 13    Last Pain:  Filed Vitals:   01/17/16 1205  PainSc: 2                  Hanad Leino,JAMES TERRILL

## 2016-01-17 NOTE — Discharge Instructions (Signed)
DISCHARGE INSTRUCTIONS FOR PROSTATE SEED IMPLANTATION  Removal of catheter Remove the foley catheter after 24 hours ( day after the procedure).can be done easily by cutting the side port of the catheter, which will allow the balloon to deflate.  You will see 1-2 teaspoons of clear water as the balloon deflates and then the catheter can be slid out without difficulty.        Cut here  Antibiotics You may be given a prescription for an antibiotic to take when you arrive home. If so, be sure to take every tablet in the bottle, even if you are feeling better before the prescription is finished. If you begin itching, notice a rash or start to swell on your trunk, arms, legs and/or throat, immediately stop taking the antibiotic and call your Urologist. Diet Resume your usual diet when you return home. To keep your bowels moving easily and softly, drink prune, apple and cranberry juice at room temperature. You may also take a stool softener, such as Colace, which is available without prescription at local pharmacies. Daily activities ? No driving or heavy lifting for at least two days after the implant. ? No bike riding, horseback riding or riding lawn mowers for the first month after the implant. ? Any strenuous physical activity should be approved by your doctor before you resume it. Sexual relations You may resume sexual relations two weeks after the procedure. A condom should be used for the first two weeks. Your semen may be dark brown or black; this is normal and is related bleeding that may have occurred during the implant. Postoperative swelling Expect swelling and bruising of the scrotum and perineum (the area between the scrotum and anus). Both the swelling and the bruising should resolve in l or 2 weeks. Ice packs and over- the-counter medications such as Tylenol, Advil or Aleve may lessen your discomfort. Postoperative urination Most men experience burning on urination and/or urinary  frequency. If this becomes bothersome, contact your Urologist.  Medication can be prescribed to relieve these problems.  It is normal to have some blood in your urine for a few days after the implant. Special instructions related to the seeds It is unlikely that you will pass an Iodine-125 seed in your urine. The seeds are silver in color and are about as large as a grain of rice. If you pass a seed, do not handle it with your fingers. Use a spoon to place it in an envelope or jar in place this in base occluded area such as the garage or basement for return to the radiation clinic at your convenience.  Contact your doctor for ? Temperature greater than 101 F ? Increasing pain ? Inability to urinate Follow-up  You should have follow up with your urologist and radiation oncologist about 3 weeks after the procedure. General information regarding Iodine seeds ? Iodine-125 is a low energy radioactive material. It is not deeply penetrating and loses energy at short distances. Your prostate will absorb the radiation. Objects that are touched or used by the patient do not become radioactive. ? Body wastes (urine and stool) or body fluids (saliva, tears, semen or blood) are not radioactive. ? The Nuclear Regulatory Commission [facility]) has determined that no radiation precautions are needed for patients undergoing Iodine-125 seed implantation. The [facility] states that such patients do not present a risk to the people around them, including young children and pregnant women. However, in keeping with the general principle that radiation exposure should be kept as  low reasonably possible, we suggest the following: ? Children and pets should not sit on the patient's lap for the first two (2) weeks after the implant. ? Pregnant (or possibly pregnant) women should avoid prolonged, close contact with the patient for the first two (2) weeks after the implant. ? A distance of three (3) feet is acceptable. ? At a distance of  three (3) feet, there is no limit to the length of time anyone can be with the patient.   Post Anesthesia Home Care Instructions  Activity: Get plenty of rest for the remainder of the day. A responsible adult should stay with you for 24 hours following the procedure.  For the next 24 hours, DO NOT: -Drive a car -Paediatric nurse -Drink alcoholic beverages -Take any medication unless instructed by your physician -Make any legal decisions or sign important papers.  Meals: Start with liquid foods such as gelatin or soup. Progress to regular foods as tolerated. Avoid greasy, spicy, heavy foods. If nausea and/or vomiting occur, drink only clear liquids until the nausea and/or vomiting subsides. Call your physician if vomiting continues.  Special Instructions/Symptoms: Your throat may feel dry or sore from the anesthesia or the breathing tube placed in your throat during surgery. If this causes discomfort, gargle with warm salt water. The discomfort should disappear within 24 hours.  If you had a scopolamine patch placed behind your ear for the management of post- operative nausea and/or vomiting:  1. The medication in the patch is effective for 72 hours, after which it should be removed.  Wrap patch in a tissue and discard in the trash. Wash hands thoroughly with soap and water. 2. You may remove the patch earlier than 72 hours if you experience unpleasant side effects which may include dry mouth, dizziness or visual disturbances. 3. Avoid touching the patch. Wash your hands with soap and water after contact with the patch.

## 2016-01-17 NOTE — H&P (Signed)
Elijah Roberts is a 60 year old male with newly diagnosed prostate cancer.   History of Present Illness       Elevated PSA: He believes his PSA several years ago was 3 and was then rechecked and was slightly lower. No first-degree family history of prostate cancer. No abnormality was noted on DRE.  TRUS/BX 10/25/15: PSA - 4.32/8%, prostate volume - 30.5 cc, no abnormality noted with an intact capsule.  Pathology: Adenocarcinoma Gleason score 3+4 = 7 in 1 core and 3+3 = 6 in 1 core both from the left lobe.   Stage: T1c    Past Medical History Problems  1. History of arthritis (Z87.39) 2. History of depression (Z86.59) 3. History of glaucoma (Z86.69) 4. History of nonmelanoma skin cancer [id number]  Surgical History Problems  1. History of Knee Surgery 2. History of Knee Surgery Right 3. History of Wrist Surgery  Current Meds 1. Aspirin TABS;  Therapy: (Recorded:29Sep2015) to Recorded 2. Cosopt SOLN;  Therapy: (Recorded:30Mar2016) to Recorded 3. Ibuprofen TABS;  Therapy: (Recorded:29Sep2015) to Recorded 4. Levofloxacin 500 MG Oral Tablet; 1 tablet the day before the procedure, 1 tablet the day of  the procedure, and 1 tablet the day after the procedure;  Therapy: 05Oct2016 to (Last Rx:05Oct2016)  Requested for: 05Oct2016 Ordered 5. Lumigan 0.01 % Ophthalmic Solution;  Therapy: (Recorded:29Sep2015) to Recorded 6. MetFORMIN HCl - 500 MG Oral Tablet;  Therapy: (Recorded:05Oct2016) to Recorded  Allergies Medication  1. FLU (Surf Ag) 2. TETANUS 3. Altace CAPS Non-Medication  4. Bee sting  Family History Problems  1. Family history of malignant neoplasm of prostate [id number] : Mother, Uncle  Social History Problems  1. Denied: History of Alcohol use 2. Caffeine use (F15.90) 3. Married 4. Never a smoker 5. Occupation   paramedic   Vitals Vital Signs Height: 6 ft 1 in Weight: 245 lb  BMI Calculated: 32.32 BSA Calculated: 2.35 Blood Pressure: 172 / 82 Heart  Rate: 73  Review of Systems Genitourinary, constitutional, skin, eye, otolaryngeal, hematologic/lymphatic, cardiovascular, pulmonary, endocrine, musculoskeletal, gastrointestinal, neurological and psychiatric system(s) were reviewed and pertinent findings if present are noted.  Gastrointestinal: diarrhea.  ENT: sinus problems.     Physical Exam Constitutional: Well nourished and well developed . No acute distress.  ENT:. The ears and nose are normal in appearance.  Neck: The appearance of the neck is normal and no neck mass is present.  Pulmonary: No respiratory distress and normal respiratory rhythm and effort.  Cardiovascular: Heart rate and rhythm are normal . No peripheral edema.  Abdomen: The abdomen is soft and nontender. No masses are palpated. No CVA tenderness. No hernias are palpable. No hepatosplenomegaly noted.  Rectal: Rectal exam demonstrates normal sphincter tone, no tenderness and no masses. The prostate has no nodularity and is not tender. The left seminal vesicle is nonpalpable. The right seminal vesicle is nonpalpable. The perineum is normal on inspection.  Genitourinary: Examination of the penis demonstrates no discharge, no masses, no lesions and a normal meatus. The scrotum is without lesions. The right epididymis is palpably normal and non-tender. The left epididymis is palpably normal and non-tender. The right testis is non-tender and without masses. The left testis is non-tender and without masses.  Lymphatics: The femoral and inguinal nodes are not enlarged or tender.  Skin: Normal skin turgor, no visible rash and no visible skin lesions.  Neuro/Psych:. Mood and affect are appropriate.        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  Plan   I went over his pathology report with him as well as the significance of his Gleason score, number and location of cores positive and percent of cores positive. We then  discussed his Partin table results in detail and the significance of these predictions as far as prognosis and need for further workup are concerned. I then discussed with him the various options available including active surveillance and treatment for cure such as radiation and surgery. We briefly discussed the forms of radiation available. I also gave him written information outlining the disease, its workup and the options for treatment for him to review further. He wanted to spend a little further time today discussing his treatment options so we did so.  The patient was counseled about the natural history of prostate cancer and the standard treatment options that are available for prostate cancer. It was explained to him how his age and life expectancy, clinical stage, Gleason score, and PSA affect his prognosis, the decision to proceed with additional staging studies, as well as how that information influences recommended treatment strategies. We discussed the roles for active surveillance, radiation therapy, surgical therapy, androgen deprivation, as well as ablative therapy options for the treatment of prostate cancer as appropriate to his individual cancer situation. We discussed the risks and benefits of these options with regard to their impact on cancer control and also in terms of potential adverse events, complications, and impact on quiality of life particularly related to urinary, bowel, and sexual function. The patient was encouraged to ask questions throughout the discussion today and all questions were answered to his stated satisfaction. In addition, the patient was provided with and/or directed to appropriate resources and literature for further education about prostate cancer and treatment options.     He made his decision and wanted to proceed with radioactive seed implantation.

## 2016-01-17 NOTE — Op Note (Signed)
PATIENT:  Celene Kras  PRE-OPERATIVE DIAGNOSIS:  Adenocarcinoma of the prostate  POST-OPERATIVE DIAGNOSIS:  Same  PROCEDURE:  Procedure(s): 1. I-125 radioactive seed implantation 2. Cystoscopy  SURGEON:  Surgeon(s): Claybon Jabs  Radiation oncologist: Dr. Tyler Pita  ANESTHESIA:  General  EBL:  Minimal  DRAINS: 76 French Foley catheter  INDICATION: KASHDEN FRANCESCHINI is a 60 year old male who was found to have biopsy-proven adenocarcinoma of the prostate Gleason 7. It was low volume and clinically organ confined. He has elected to proceed with radioactive seed implantation.  Description of procedure: After informed consent the patient was brought to the major OR, placed on the table and administered general anesthesia. He was then moved to the modified lithotomy position with his perineum perpendicular to the floor. His perineum and genitalia were then sterilely prepped. An official timeout was then performed. A 16 French Foley catheter was then placed in the bladder and filled with dilute contrast, a rectal tube was placed in the rectum and the transrectal ultrasound probe was placed in the rectum and affixed to the stand. He was then sterilely draped.  Real time ultrasonography was used along with the seed planning software [employer] Prostate vs. 4.2.21. This was used to develop the seed plan including the number of needles as well as number of seeds required for complete and adequate coverage. Real-time ultrasonography was then used along with the previously developed plan and the Nucletron device to implant a total of 84 seeds using 25 needles. This proceeded without difficulty or complication.  A Foley catheter was then removed as well as the transrectal ultrasound probe and rectal probe. Flexible cystoscopy was then performed using the 17 French flexible scope which revealed a normal urethra throughout its length down to the sphincter which appeared intact. The prostatic urethra  revealed bilobar hypertrophy but no evidence of obstruction, seeds, spacers or lesions. The bladder was then entered and fully and systematically inspected. The ureteral orifices were noted to be of normal configuration and position. The mucosa revealed no evidence of tumors. There were also no stones identified within the bladder. I noted one seed and one spacer on the floor of the bladder which were grasped with alligator forceps and removed. Reinspection revealed no further stones or spacers present within the bladder and retroflexion of the scope revealed no seeds protruding from the base of the prostate.  The cystoscope was then removed and a new 75 French Foley catheter was then inserted and the balloon was filled with 10 cc of sterile water. This was connected to closed system drainage and the patient was awakened and taken to recovery room in stable and satisfactory condition. He tolerated procedure well and there were no intraoperative complications.

## 2016-01-17 NOTE — Addendum Note (Signed)
Addendum  created 01/17/16 1208 by Rica Koyanagi, MD   Modules edited: Orders, PRL Based Order Sets

## 2016-01-17 NOTE — Anesthesia Procedure Notes (Signed)
Procedure Name: LMA Insertion Date/Time: 01/17/2016 9:45 AM Performed by: Mechele Claude Pre-anesthesia Checklist: Patient identified, Emergency Drugs available, Suction available and Patient being monitored Patient Re-evaluated:Patient Re-evaluated prior to inductionOxygen Delivery Method: Circle System Utilized Preoxygenation: Pre-oxygenation with 100% oxygen Intubation Type: IV induction Ventilation: Mask ventilation without difficulty LMA: LMA inserted LMA Size: 5.0 Number of attempts: 1 Airway Equipment and Method: bite block Placement Confirmation: positive ETCO2 Tube secured with: Tape Dental Injury: Teeth and Oropharynx as per pre-operative assessment

## 2016-01-19 NOTE — Progress Notes (Signed)
  Radiation Oncology         [phone]) [phone] ________________________________  Name: Elijah Roberts MRN: [id number]  Date: 01/19/2016  DOB: 06/19/1956       Prostate Seed Implant  [id number], PA-C  No ref. provider found  DIAGNOSIS: 60 y.o. gentleman with stage T1c adenocarcinoma of the prostate with a Gleason's score of 3+4 and a PSA of 4.32    ICD-9-CM ICD-10-CM   1. Prostate cancer ([facility]) 185 C61 DG Chest 2 View     DG Chest 2 View     Discharge patient     CANCELED: DG Abd 1 View     CANCELED: DG Abd 1 View    PROCEDURE: Insertion of radioactive I-125 seeds into the prostate gland.  RADIATION DOSE: 145 Gy, definitive therapy.  TECHNIQUE: Elijah Roberts was brought to the operating room with the urologist. He was placed in the dorsolithotomy position. He was catheterized and a rectal tube was inserted. The perineum was shaved, prepped and draped. The ultrasound probe was then introduced into the rectum to see the prostate gland.  TREATMENT DEVICE: A needle grid was attached to the ultrasound probe stand and anchor needles were placed.  3D PLANNING: The prostate was imaged in 3D using a sagittal sweep of the prostate probe. These images were transferred to the planning computer. There, the prostate, urethra and rectum were defined on each axial reconstructed image. Then, the software created an optimized 3D plan and a few seed positions were adjusted. The quality of the plan was reviewed using [facility] information for the target and the following two organs at risk:  Urethra and Rectum.  Then the accepted plan was uploaded to the seed Selectron afterloading unit.  PROSTATE VOLUME STUDY:  Using transrectal ultrasound the volume of the prostate was verified to be 48.6 cc.  SPECIAL TREATMENT PROCEDURE/SUPERVISION AND HANDLING: The Nucletron FIRST system was used to place the needles under sagittal guidance. A total of 25 needles were used to deposit 84 seeds in the prostate gland. The  individual seed activity was 0.423 mCi.  COMPLEX SIMULATION: At the end of the procedure, an anterior radiograph of the pelvis was obtained to document seed positioning and count. Cystoscopy was performed to check the urethra and bladder.  One of the implanted seeds was loose in the bladder and removed, leaving 83 implanted seeds  MICRODOSIMETRY: At the end of the procedure, the patient was emitting 1.093 mR/hr at 1 meter. Accordingly, he was considered safe for hospital discharge.  PLAN: The patient will return to the radiation oncology clinic for post implant CT dosimetry in three weeks.   ________________________________  Sheral Apley Tammi Klippel, M.D.

## 2016-01-20 ENCOUNTER — Encounter (HOSPITAL_BASED_OUTPATIENT_CLINIC_OR_DEPARTMENT_OTHER): Payer: Self-pay | Admitting: Urology

## 2016-02-03 MED FILL — TAMSULOSIN HCL 0.4 MG CAP: 0.4 | 30 days supply | Qty: 30 | Fill #0

## 2016-02-03 MED FILL — LUMIGAN 0.01% EYE DROPS: 0.01 | 90 days supply | Qty: 10 | Fill #0

## 2016-02-04 MED FILL — DORZOLAMIDE-TIMOLOL EYE DRP: 22.3-6.8 | 90 days supply | Qty: 20 | Fill #0

## 2016-02-06 ENCOUNTER — Telehealth: Payer: Self-pay | Admitting: *Deleted

## 2016-02-06 NOTE — Telephone Encounter (Signed)
CALLED PATIENT TO INFORM OF POST SEED APPTS. FOR 02-07-16, SPOKE WITH PATIENT AND HE IS AWARE OF THESE APPTS.

## 2016-02-07 ENCOUNTER — Ambulatory Visit
Admission: RE | Admit: 2016-02-07 | Discharge: 2016-02-07 | Disposition: A | Discharge status: Home / Self Care | Payer: 59 | Source: Ambulatory Visit | Attending: Radiation Oncology | Admitting: Radiation Oncology

## 2016-02-07 ENCOUNTER — Ambulatory Visit
Admit: 2016-02-07 | Discharge: 2016-02-07 | Disposition: A | Discharge status: Home / Self Care | Payer: 59 | Attending: Radiation Oncology | Admitting: Radiation Oncology

## 2016-02-07 ENCOUNTER — Encounter: Payer: Self-pay | Admitting: Radiation Oncology

## 2016-02-07 VITALS — BP 136/96 | HR 94 | Resp 16

## 2016-02-07 DIAGNOSIS — Z510 Encounter for antineoplastic radiation therapy: Secondary | ICD-10-CM | POA: Insufficient documentation

## 2016-02-07 DIAGNOSIS — C61 Malignant neoplasm of prostate: Secondary | ICD-10-CM

## 2016-02-07 DIAGNOSIS — Z0000 Encounter for general adult medical examination without abnormal findings: Secondary | ICD-10-CM | POA: Diagnosis not present

## 2016-02-07 NOTE — Progress Notes (Signed)
BP slight elevated. Patient reports taking Sudafed this morning for a sinus infection.Denies pain. Reports dysuria and hematuria s/p seed place and catheter removal have resolved. Reports his symptoms are more manageable since his urologist prescribed Flomax. Weak stream, straining to void, and incomplete emptying are his major complaints. Reports he felt bruising, swelling and some difficulty sitting just following seed placement.   BP 136/96 mmHg  Pulse 94  Resp 16  SpO2 100% Wt Readings from Last 3 Encounters:  01/17/16 259 lb 8 oz (117.708 kg)  11/21/15 263 lb 6.4 oz (119.477 kg)

## 2016-02-07 NOTE — Progress Notes (Signed)
Radiation Oncology         [phone]) [phone] ________________________________  Name: Elijah Roberts MRN: [id number]  Date: 02/07/2016  DOB: 12/13/1956  Follow-Up Visit Note  CC: Donata Clay, MD  Diagnosis:   Stage T1c adenocarcinoma of the prostate with a Gleason's score of 3+4 and a PSA of 4.32    ICD-9-CM ICD-10-CM   1. Malignant neoplasm of prostate ([facility]) 185 C61     Interval Since Last Radiation:  3  weeks  Narrative:  The patient returns today for routine follow-up.  He is complaining of increased urinary frequency and urinary hesitation symptoms. He filled out a questionnaire regarding urinary function today providing and overall IPSS score of 21 characterizing his symptoms as severe.  His pre-implant score was 2. He denies any bowel symptoms.  ALLERGIES:  is allergic to bee venom; altace; other; and tetanus toxoids.  Meds: Current Outpatient Prescriptions  Medication Sig Dispense Refill  . amLODipine (NORVASC) 5 MG tablet Take 5 mg by mouth daily.    Marland Kitchen aspirin 81 MG tablet Take 81 mg by mouth daily.    . bimatoprost (LUMIGAN) 0.03 % ophthalmic solution Place 1 drop into both eyes at bedtime.     . calcium carbonate (TUMS - DOSED IN MG ELEMENTAL CALCIUM) 500 MG chewable tablet Chew 1 tablet by mouth as needed for indigestion or heartburn.    . cetirizine (ZYRTEC) 10 MG tablet Take 10 mg by mouth daily as needed for allergies.    . CHROMIUM PO Take by mouth daily.    . dorzolamide-timolol (COSOPT) 22.3-6.8 MG/ML ophthalmic solution Place 1 drop into both eyes 2 (two) times daily.     Marland Kitchen EPIPEN 2-PAK 0.3 MG/0.3ML SOAJ injection   11  . HYDROcodone-acetaminophen (NORCO) 7.5-325 MG tablet Take 1-2 tablets by mouth every 4 (four) hours as needed for moderate pain. Maximum dose per 24 hours - 8 pills 20 tablet 0  . ibuprofen (ADVIL,MOTRIN) 200 MG tablet Take 200 mg by mouth every 6 (six) hours as needed.    . metFORMIN (GLUCOPHAGE-XR) 500 MG 24 hr tablet   3  .  Multiple Vitamin (MULTIVITAMIN) tablet Take 1 tablet by mouth daily.    . tamsulosin (FLOMAX) 0.4 MG CAPS capsule Take 0.4 mg by mouth.     No current facility-administered medications for this encounter.    Physical Findings: The patient is in no acute distress. Patient is alert and oriented.  blood pressure is 136/96 and his pulse is 94. His respiration is 16 and oxygen saturation is 100%. .  No significant changes.  Lab Findings: Lab Results  Component Value Date   WBC 7.0 01/10/2016   HGB 16.0 01/10/2016   HCT 46.1 01/10/2016   MCV 83.5 01/10/2016   PLT 203 01/10/2016    Radiographic Findings:  Patient underwent CT imaging in our clinic for post implant dosimetry. The CT appears to demonstrate an adequate distribution of radioactive seeds throughout the prostate gland. There no seeds in her near the rectum. I suspect the final radiation plan and dosimetry will show appropriate coverage of the prostate gland.   Impression: The patient is recovering from the effects of radiation. His urinary symptoms should gradually improve over the next 4-6 months. We talked about this today. He is encouraged by his improvement already and is otherwise please with his outcome.   Plan: Today, I spent time talking to the patient about his prostate seed implant and resolving urinary symptoms. We also talked about  long-term follow-up for prostate cancer following seed implant. He understands that ongoing PSA determinations and digital rectal exams will help perform surveillance to rule out disease recurrence. He understands what to expect with his PSA measures. Patient was also educated today about some of the long-term effects from radiation including a small risk for rectal bleeding and possibly erectile dysfunction. We talked about some of the general management approaches to these potential complications. However, I did encourage the patient to contact our office or return at any point if he has  questions or concerns related to his previous radiation and prostate cancer.  _____________________________________  Sheral Apley. Tammi Klippel, M.D.  This document serves as a record of services personally performed by Tyler Pita, MD. It was created on his behalf by Derek Mound, a trained medical scribe. The creation of this record is based on the scribe's personal observations and the provider's statements to them. This document has been checked and approved by the attending provider.

## 2016-02-07 NOTE — Progress Notes (Signed)
  Radiation Oncology         [phone]) [phone] ________________________________  Name: Elijah Roberts MRN: [id number]  Date: 02/07/2016  DOB: May 23, 1956  COMPLEX SIMULATION NOTE  NARRATIVE:  The patient was brought to the [facility] today following prostate seed implantation approximately one month ago.  Identity was confirmed.  All relevant records and images related to the planned course of therapy were reviewed.  Then, the patient was set-up supine.  CT images were obtained.  The CT images were loaded into the planning software.  Then the prostate and rectum were contoured.  Treatment planning then occurred.  The implanted iodine 125 seeds were identified by the physics staff for projection of radiation distribution  I have requested : 3D Simulation  I have requested a DVH of the following structures: Prostate and rectum.    ________________________________  Sheral Apley Tammi Klippel, M.D.

## 2016-02-12 ENCOUNTER — Encounter: Payer: Self-pay | Admitting: Radiation Oncology

## 2016-02-12 DIAGNOSIS — C61 Malignant neoplasm of prostate: Secondary | ICD-10-CM | POA: Diagnosis not present

## 2016-02-12 DIAGNOSIS — Z510 Encounter for antineoplastic radiation therapy: Secondary | ICD-10-CM | POA: Diagnosis not present

## 2016-02-20 DIAGNOSIS — L570 Actinic keratosis: Secondary | ICD-10-CM | POA: Diagnosis not present

## 2016-02-20 DIAGNOSIS — D2261 Melanocytic nevi of right upper limb, including shoulder: Secondary | ICD-10-CM | POA: Diagnosis not present

## 2016-03-01 MED FILL — TAMSULOSIN HCL 0.4 MG CAP: 0.4 | 30 days supply | Qty: 30 | Fill #1

## 2016-03-01 NOTE — Progress Notes (Signed)
  Radiation Oncology         [phone] ________________________________  Name: [name] MRN: [id number]  Date: 02/12/2016  DOB: 12/01/1956  3D Planning Note   Prostate Brachytherapy Post-Implant Dosimetry  Diagnosis: 60 y.o. gentleman with stage T1c adenocarcinoma of the prostate with a Gleason's score of 3+4 and a PSA of 4.32  Narrative: On a previous date, Elijah Roberts returned following prostate seed implantation for post implant planning. He underwent CT scan complex simulation to delineate the three-dimensional structures of the pelvis and demonstrate the radiation distribution.  Since that time, the seed localization, and complex isodose planning with dose volume histograms have now been completed.  Results:   Prostate Coverage - The dose of radiation delivered to the 90% or more of the prostate gland (D90) was 102.87% of the prescription dose. This exceeds our goal of greater than 90%. Rectal Sparing - The volume of rectal tissue receiving the prescription dose or higher was 0.0 cc. This falls under our thresholds tolerance of 1.0 cc.  Impression: The prostate seed implant appears to show adequate target coverage and appropriate rectal sparing.  Plan:  The patient will continue to follow with urology for ongoing PSA determinations. I would anticipate a high likelihood for local tumor control with minimal risk for rectal morbidity.  ________________________________  Sheral Apley Tammi Klippel, M.D.

## 2016-03-02 DIAGNOSIS — C61 Malignant neoplasm of prostate: Secondary | ICD-10-CM | POA: Diagnosis not present

## 2016-03-03 MED FILL — AMLODIPINE BESYLATE 5 MG TA: 5 | 90 days supply | Qty: 90 | Fill #1

## 2016-03-23 MED FILL — METFORMIN HCL ER 500 MG TAB: 500 | 90 days supply | Qty: 360 | Fill #1

## 2016-04-01 MED FILL — TAMSULOSIN HCL 0.4 MG CAP: 0.4 | 30 days supply | Qty: 30 | Fill #2

## 2016-04-16 MED FILL — TAMSULOSIN HCL 0.4 MG CAP: 0.4 | 30 days supply | Qty: 60 | Fill #0

## 2016-04-28 DIAGNOSIS — C61 Malignant neoplasm of prostate: Secondary | ICD-10-CM | POA: Diagnosis not present

## 2016-04-29 MED FILL — DORZOLAMIDE-TIMOLOL EYE DRP: 22.3-6.8 | 90 days supply | Qty: 20 | Fill #1

## 2016-04-30 MED FILL — LUMIGAN 0.01% EYE DROPS: 0.01 | 90 days supply | Qty: 10 | Fill #1

## 2016-05-05 DIAGNOSIS — R350 Frequency of micturition: Secondary | ICD-10-CM | POA: Diagnosis not present

## 2016-05-05 DIAGNOSIS — Z0000 Encounter for general adult medical examination without abnormal findings: Secondary | ICD-10-CM | POA: Diagnosis not present

## 2016-05-05 DIAGNOSIS — R300 Dysuria: Secondary | ICD-10-CM | POA: Diagnosis not present

## 2016-05-15 MED FILL — TAMSULOSIN HCL 0.4 MG CAP: 0.4 | 30 days supply | Qty: 60 | Fill #1

## 2016-06-18 MED FILL — TAMSULOSIN HCL 0.4 MG CAP: 0.4 | 30 days supply | Qty: 60 | Fill #0

## 2016-06-29 MED FILL — METFORMIN HCL ER 500 MG TAB: 500 | 90 days supply | Qty: 360 | Fill #2

## 2016-07-17 MED FILL — MYRBETRIQ ER 50 MG TABLET: 50 | 30 days supply | Qty: 30 | Fill #0

## 2016-07-20 MED FILL — TAMSULOSIN HCL 0.4 MG CAP: 0.4 | 30 days supply | Qty: 60 | Fill #1

## 2016-08-03 MED FILL — LUMIGAN 0.01% EYE DROPS: 0.01 | 90 days supply | Qty: 10 | Fill #2

## 2016-08-03 MED FILL — AMLODIPINE BESYLATE 5 MG TA: 5 | 90 days supply | Qty: 90 | Fill #2

## 2016-08-03 MED FILL — DORZOLAMIDE-TIMOLOL EYE DRP: 22.3-6.8 | 90 days supply | Qty: 20 | Fill #2

## 2016-08-11 MED FILL — MYRBETRIQ ER 50 MG TABLET: 50 | 30 days supply | Qty: 30 | Fill #1

## 2016-08-20 DIAGNOSIS — D171 Benign lipomatous neoplasm of skin and subcutaneous tissue of trunk: Secondary | ICD-10-CM | POA: Diagnosis not present

## 2016-08-20 DIAGNOSIS — Z85820 Personal history of malignant melanoma of skin: Secondary | ICD-10-CM | POA: Diagnosis not present

## 2016-08-20 DIAGNOSIS — D225 Melanocytic nevi of trunk: Secondary | ICD-10-CM | POA: Diagnosis not present

## 2016-08-20 DIAGNOSIS — L821 Other seborrheic keratosis: Secondary | ICD-10-CM | POA: Diagnosis not present

## 2016-08-20 DIAGNOSIS — C44319 Basal cell carcinoma of skin of other parts of face: Secondary | ICD-10-CM | POA: Diagnosis not present

## 2016-08-20 DIAGNOSIS — D2271 Melanocytic nevi of right lower limb, including hip: Secondary | ICD-10-CM | POA: Diagnosis not present

## 2016-08-20 DIAGNOSIS — D485 Neoplasm of uncertain behavior of skin: Secondary | ICD-10-CM | POA: Diagnosis not present

## 2016-08-20 DIAGNOSIS — L570 Actinic keratosis: Secondary | ICD-10-CM | POA: Diagnosis not present

## 2016-08-21 MED FILL — TAMSULOSIN HCL 0.4 MG CAP: 0.4 | 30 days supply | Qty: 60 | Fill #2

## 2016-09-13 MED FILL — MYRBETRIQ ER 50 MG TABLET: 50 | 30 days supply | Qty: 30 | Fill #2

## 2016-09-17 MED FILL — TAMSULOSIN HCL 0.4 MG CAP: 0.4 | 30 days supply | Qty: 60 | Fill #3

## 2016-09-30 MED FILL — METFORMIN HCL ER 500 MG TAB: 500 | 90 days supply | Qty: 360 | Fill #3

## 2016-10-12 MED FILL — MYRBETRIQ ER 50 MG TABLET: 50 | 30 days supply | Qty: 30 | Fill #3

## 2016-10-19 MED FILL — TAMSULOSIN HCL 0.4 MG CAP: 0.4 | 30 days supply | Qty: 60 | Fill #4

## 2016-11-04 DIAGNOSIS — Z8546 Personal history of malignant neoplasm of prostate: Secondary | ICD-10-CM | POA: Diagnosis not present

## 2016-11-04 DIAGNOSIS — R3915 Urgency of urination: Secondary | ICD-10-CM | POA: Diagnosis not present

## 2016-11-04 MED FILL — LUMIGAN 0.01% EYE DROPS: 0.01 | 90 days supply | Qty: 10 | Fill #3

## 2016-11-04 MED FILL — DORZOLAMIDE-TIMOLOL EYE DRP: 22.3-6.8 | 90 days supply | Qty: 20 | Fill #3

## 2016-11-10 DIAGNOSIS — E119 Type 2 diabetes mellitus without complications: Secondary | ICD-10-CM | POA: Diagnosis not present

## 2016-11-10 DIAGNOSIS — E781 Pure hyperglyceridemia: Secondary | ICD-10-CM | POA: Diagnosis not present

## 2016-11-10 DIAGNOSIS — I10 Essential (primary) hypertension: Secondary | ICD-10-CM | POA: Diagnosis not present

## 2016-11-17 DIAGNOSIS — H401131 Primary open-angle glaucoma, bilateral, mild stage: Secondary | ICD-10-CM | POA: Diagnosis not present

## 2016-11-17 DIAGNOSIS — H5211 Myopia, right eye: Secondary | ICD-10-CM | POA: Diagnosis not present

## 2016-11-18 DIAGNOSIS — E119 Type 2 diabetes mellitus without complications: Secondary | ICD-10-CM | POA: Diagnosis not present

## 2016-11-18 DIAGNOSIS — I10 Essential (primary) hypertension: Secondary | ICD-10-CM | POA: Diagnosis not present

## 2016-11-18 MED FILL — AMLODIPINE BESYLATE 5 MG TA: 5 | 90 days supply | Qty: 90 | Fill #0

## 2016-11-23 MED FILL — TAMSULOSIN HCL 0.4 MG CAP: 0.4 | 90 days supply | Qty: 180 | Fill #5

## 2016-12-15 MED FILL — JANUVIA 100 MG TABLET: 100 | 30 days supply | Qty: 30 | Fill #0

## 2016-12-30 MED FILL — METFORMIN HCL ER 500 MG TAB: 500 | 90 days supply | Qty: 360 | Fill #0

## 2017-02-03 MED FILL — COMBIGAN EYE DROPS: 0.2-0.5 | 25 days supply | Qty: 5 | Fill #0

## 2017-02-17 DIAGNOSIS — D485 Neoplasm of uncertain behavior of skin: Secondary | ICD-10-CM | POA: Diagnosis not present

## 2017-02-17 DIAGNOSIS — D2271 Melanocytic nevi of right lower limb, including hip: Secondary | ICD-10-CM | POA: Diagnosis not present

## 2017-02-17 DIAGNOSIS — L821 Other seborrheic keratosis: Secondary | ICD-10-CM | POA: Diagnosis not present

## 2017-02-17 DIAGNOSIS — D225 Melanocytic nevi of trunk: Secondary | ICD-10-CM | POA: Diagnosis not present

## 2017-02-17 DIAGNOSIS — L570 Actinic keratosis: Secondary | ICD-10-CM | POA: Diagnosis not present

## 2017-02-17 DIAGNOSIS — D2272 Melanocytic nevi of left lower limb, including hip: Secondary | ICD-10-CM | POA: Diagnosis not present

## 2017-02-17 DIAGNOSIS — Z85820 Personal history of malignant melanoma of skin: Secondary | ICD-10-CM | POA: Diagnosis not present

## 2017-02-17 DIAGNOSIS — Z85828 Personal history of other malignant neoplasm of skin: Secondary | ICD-10-CM | POA: Diagnosis not present

## 2017-02-17 DIAGNOSIS — D224 Melanocytic nevi of scalp and neck: Secondary | ICD-10-CM | POA: Diagnosis not present

## 2017-02-17 DIAGNOSIS — D171 Benign lipomatous neoplasm of skin and subcutaneous tissue of trunk: Secondary | ICD-10-CM | POA: Diagnosis not present

## 2017-02-22 MED FILL — TAMSULOSIN HCL 0.4 MG CAP: 0.4 | 90 days supply | Qty: 180 | Fill #6

## 2017-03-03 MED FILL — DORZOLAMIDE-TIMOLOL EYE DRP: 22.3-6.8 | 50 days supply | Qty: 10 | Fill #0

## 2017-03-24 MED FILL — TIMOLOL 0.5% EYE DROPS: 0.5 | 25 days supply | Qty: 5 | Fill #0

## 2017-03-24 MED FILL — DORZOLAMIDE HCL 2% EYE DRP: 2 | 50 days supply | Qty: 10 | Fill #0

## 2017-03-24 MED FILL — LUMIGAN 0.01% EYE DROPS: 0.01 | 30 days supply | Qty: 3 | Fill #0

## 2017-04-05 MED FILL — METFORMIN HCL ER 500 MG TAB: 500 | 90 days supply | Qty: 360 | Fill #1

## 2017-05-04 DIAGNOSIS — Z8546 Personal history of malignant neoplasm of prostate: Secondary | ICD-10-CM | POA: Diagnosis not present

## 2017-05-11 DIAGNOSIS — R3915 Urgency of urination: Secondary | ICD-10-CM | POA: Diagnosis not present

## 2017-05-11 DIAGNOSIS — Z8546 Personal history of malignant neoplasm of prostate: Secondary | ICD-10-CM | POA: Diagnosis not present

## 2017-05-18 MED FILL — DORZOLAMIDE HCL 2% EYE DRP: 2 | 50 days supply | Qty: 10 | Fill #1

## 2017-05-18 MED FILL — TIMOLOL 0.5% EYE DROPS: 0.5 | 25 days supply | Qty: 5 | Fill #1

## 2017-06-17 MED FILL — LUMIGAN 0.01% EYE DROPS: 0.01 | 30 days supply | Qty: 3 | Fill #1

## 2017-06-17 MED FILL — TIMOLOL 0.5% EYE DROPS: 0.5 | 25 days supply | Qty: 5 | Fill #2

## 2017-06-17 MED FILL — AMLODIPINE BESYLATE 5 MG TA: 5 | 90 days supply | Qty: 90 | Fill #1

## 2017-07-05 MED FILL — METFORMIN HCL ER 500 MG TAB: 500 | 90 days supply | Qty: 360 | Fill #2

## 2017-07-05 MED FILL — DORZOLAMIDE HCL 2% EYE DRP: 2 | 50 days supply | Qty: 10 | Fill #2

## 2017-07-08 MED FILL — TAMSULOSIN HCL 0.4 MG CAP: 0.4 | 30 days supply | Qty: 60 | Fill #0

## 2017-07-19 MED FILL — TIMOLOL 0.5% EYE DROPS: 0.5 | 25 days supply | Qty: 5 | Fill #3

## 2017-07-29 DIAGNOSIS — N401 Enlarged prostate with lower urinary tract symptoms: Secondary | ICD-10-CM | POA: Diagnosis not present

## 2017-07-29 DIAGNOSIS — E119 Type 2 diabetes mellitus without complications: Secondary | ICD-10-CM | POA: Diagnosis not present

## 2017-07-29 DIAGNOSIS — I10 Essential (primary) hypertension: Secondary | ICD-10-CM | POA: Diagnosis not present

## 2017-07-29 DIAGNOSIS — R350 Frequency of micturition: Secondary | ICD-10-CM | POA: Diagnosis not present

## 2017-07-29 MED FILL — DOXAZOSIN MESYLATE 4 MG TAB: 4 | 90 days supply | Qty: 90 | Fill #0

## 2017-07-29 MED FILL — glipiZIDE ER 5 MG TB24: 5 | 90 days supply | Qty: 90 | Fill #0

## 2017-08-12 MED FILL — LUMIGAN 0.01% EYE DROPS: 0.01 | 30 days supply | Qty: 3 | Fill #2

## 2017-08-18 DIAGNOSIS — D225 Melanocytic nevi of trunk: Secondary | ICD-10-CM | POA: Diagnosis not present

## 2017-08-18 DIAGNOSIS — Z85828 Personal history of other malignant neoplasm of skin: Secondary | ICD-10-CM | POA: Diagnosis not present

## 2017-08-18 DIAGNOSIS — L570 Actinic keratosis: Secondary | ICD-10-CM | POA: Diagnosis not present

## 2017-08-18 DIAGNOSIS — D2271 Melanocytic nevi of right lower limb, including hip: Secondary | ICD-10-CM | POA: Diagnosis not present

## 2017-08-18 DIAGNOSIS — D2262 Melanocytic nevi of left upper limb, including shoulder: Secondary | ICD-10-CM | POA: Diagnosis not present

## 2017-08-18 DIAGNOSIS — L821 Other seborrheic keratosis: Secondary | ICD-10-CM | POA: Diagnosis not present

## 2017-08-18 DIAGNOSIS — D2261 Melanocytic nevi of right upper limb, including shoulder: Secondary | ICD-10-CM | POA: Diagnosis not present

## 2017-08-18 DIAGNOSIS — D2272 Melanocytic nevi of left lower limb, including hip: Secondary | ICD-10-CM | POA: Diagnosis not present

## 2017-08-18 DIAGNOSIS — L918 Other hypertrophic disorders of the skin: Secondary | ICD-10-CM | POA: Diagnosis not present

## 2017-08-24 MED FILL — TIMOLOL 0.5% EYE DROPS: 0.5 | 25 days supply | Qty: 5 | Fill #4

## 2017-08-24 MED FILL — DORZOLAMIDE HCL 2% EYE DRP: 2 | 50 days supply | Qty: 10 | Fill #3

## 2017-08-25 MED FILL — GAVILYTE-N SOLUTION: 420 | 1 days supply | Qty: 4000 | Fill #0

## 2017-09-02 DIAGNOSIS — Z86010 Personal history of colonic polyps: Secondary | ICD-10-CM | POA: Diagnosis not present

## 2017-09-02 DIAGNOSIS — Z8371 Family history of colonic polyps: Secondary | ICD-10-CM | POA: Diagnosis not present

## 2017-09-02 DIAGNOSIS — K635 Polyp of colon: Secondary | ICD-10-CM | POA: Diagnosis not present

## 2017-09-28 MED FILL — LUMIGAN 0.01% EYE DROPS: 0.01 | 30 days supply | Qty: 3 | Fill #3

## 2017-09-28 MED FILL — FLUOROURACIL 5% CREAM: 5 | 14 days supply | Qty: 40 | Fill #0

## 2017-09-28 MED FILL — TIMOLOL 0.5% EYE DROPS: 0.5 | 25 days supply | Qty: 5 | Fill #5

## 2017-10-05 MED FILL — DORZOLAMIDE HCL 2% EYE DRP: 2 | 50 days supply | Qty: 10 | Fill #4

## 2017-10-07 MED FILL — METFORMIN HCL ER 500 MG TAB: 500 | 90 days supply | Qty: 360 | Fill #3

## 2017-10-24 MED FILL — glipiZIDE ER 5 MG TB24: 5 | 90 days supply | Qty: 90 | Fill #1

## 2017-11-02 MED FILL — TIMOLOL 0.5% EYE DROPS: 0.5 | 25 days supply | Qty: 5 | Fill #6

## 2017-11-02 MED FILL — LUMIGAN 0.01% EYE DROPS: 0.01 | 30 days supply | Qty: 3 | Fill #4

## 2017-11-18 DIAGNOSIS — Z8546 Personal history of malignant neoplasm of prostate: Secondary | ICD-10-CM | POA: Diagnosis not present

## 2017-11-18 DIAGNOSIS — R350 Frequency of micturition: Secondary | ICD-10-CM | POA: Diagnosis not present

## 2017-11-21 IMAGING — DX DG CHEST 2V
2 series · 3 of 3 positions shown · non-contrast
Comparison: None in PACs

CLINICAL DATA: Preoperative exam prior to seed implant placement
for prostate malignancy.

EXAM:
CHEST  2 VIEW

[Series 1: chest pa · 0.14mm/px · 2 of 2 slices shown]
[im 1/2]
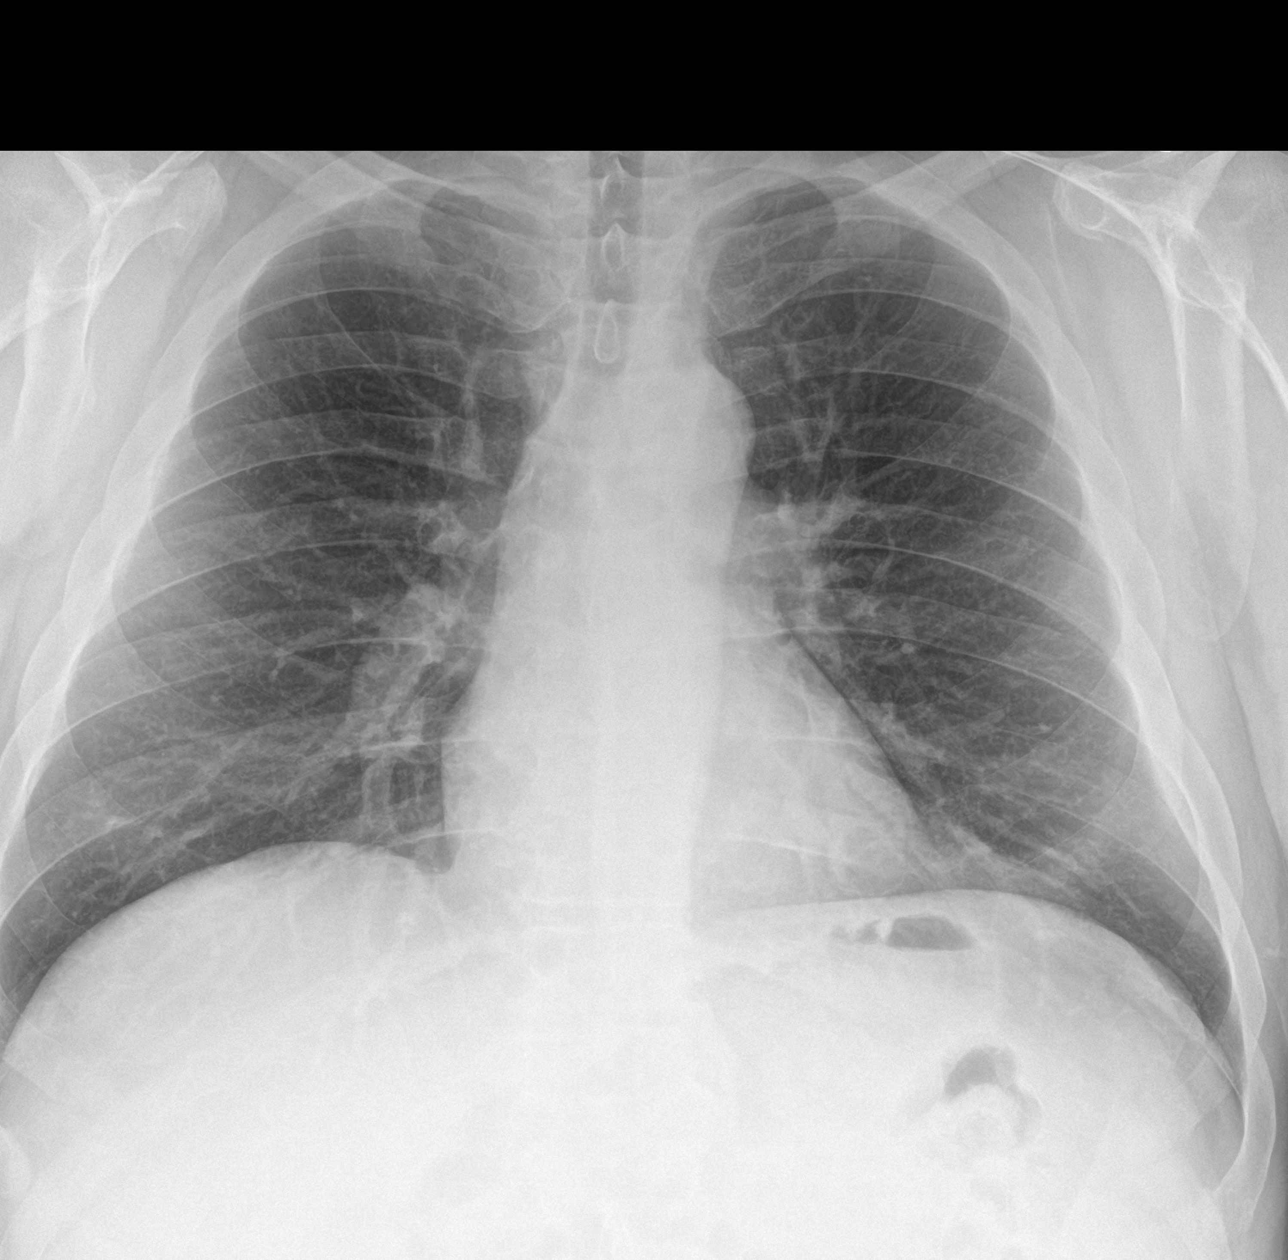
[im 2/2]
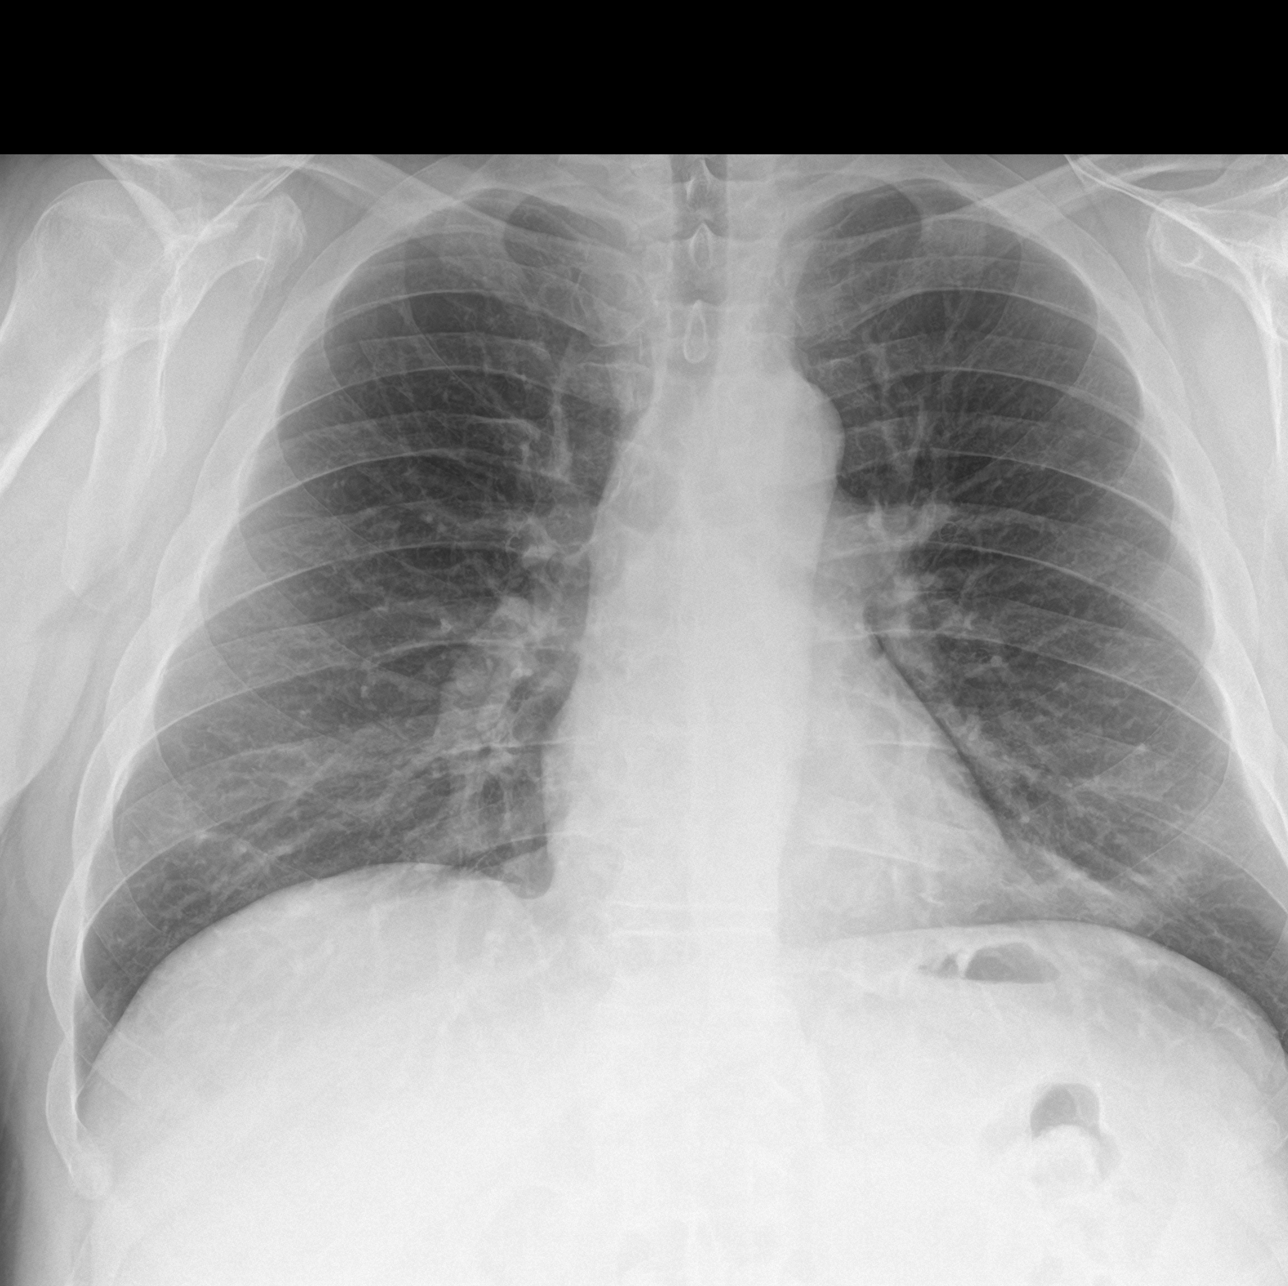

[chest lat]
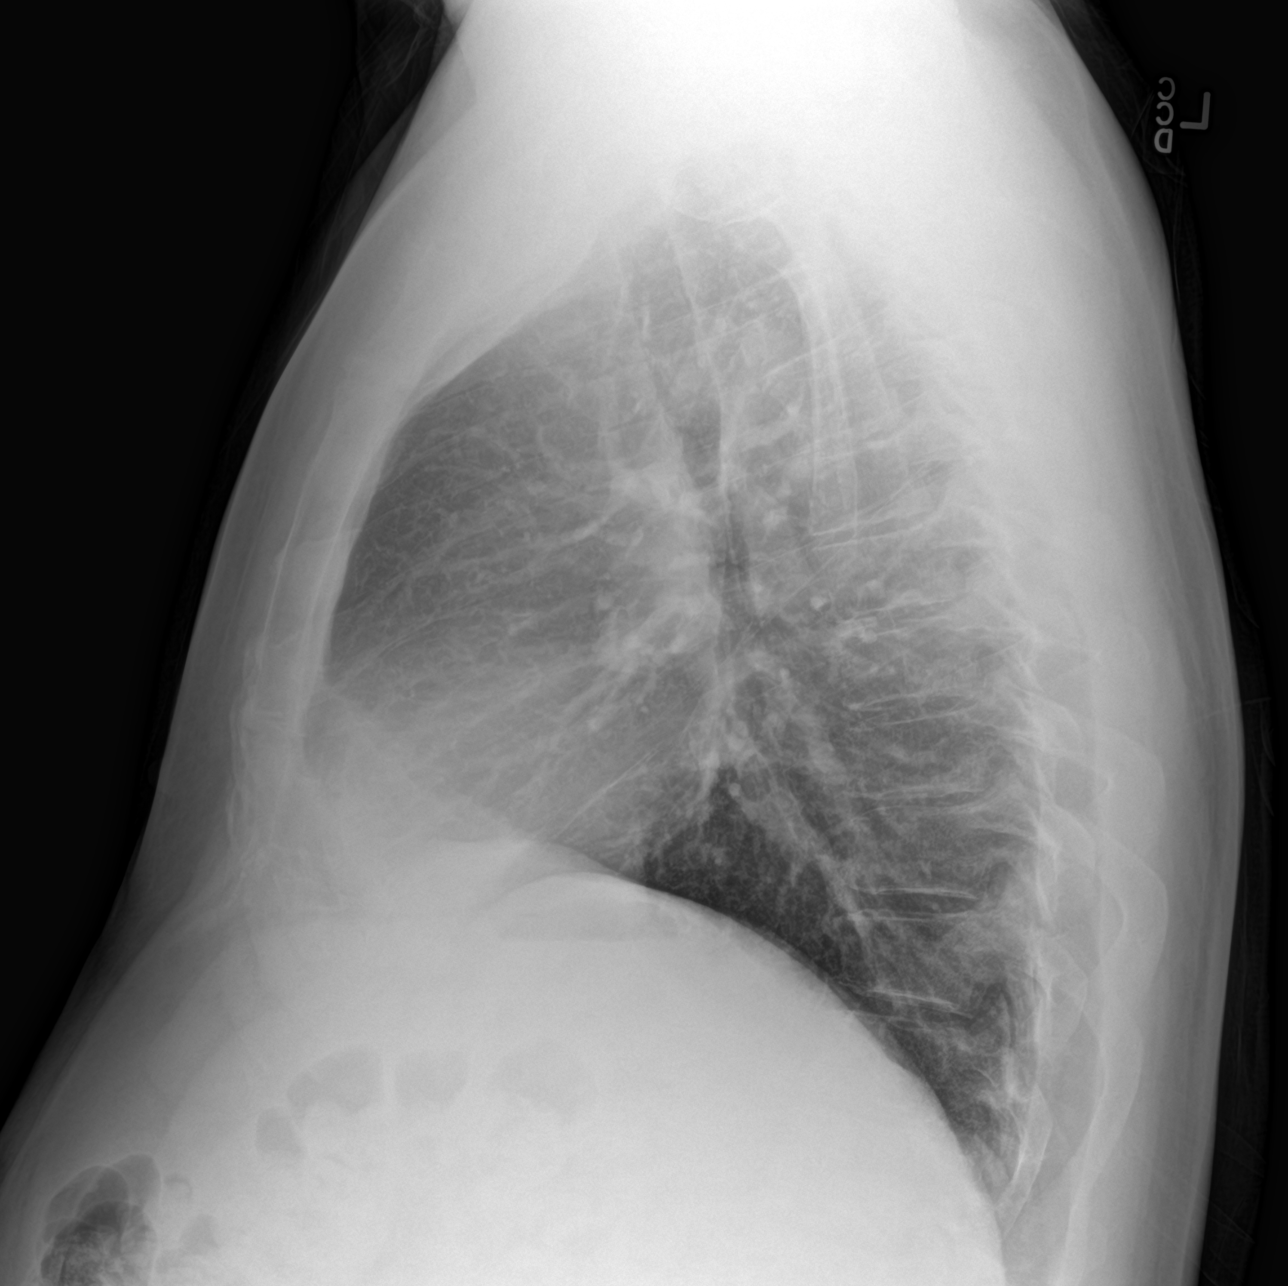

[3 of 3 positions shown; findings below may reference images not displayed]

FINDINGS: The lungs are adequately inflated and clear. The heart and pulmonary
vascularity are normal. The mediastinum is normal in width. There is
no pleural effusion. There is increased density lateral to the left
cardiac apex which appears to lie in the anterior aspect of the left
seventh rib. This is seen on the 2 AP projection submitted. It is
not evident on the lateral view. The ribs elsewhere unremarkable.
The thoracic vertebral bodies are preserved in height.
IMPRESSION: 1. There is no active cardiopulmonary disease.
2. Increased density in the anterior aspect of the left seventh rib.
This could reflect metastatic disease given the history of prostate
malignancy. Attention to this region on anticipated bone scans is
recommended.

## 2017-11-22 DIAGNOSIS — H5211 Myopia, right eye: Secondary | ICD-10-CM | POA: Diagnosis not present

## 2017-11-22 DIAGNOSIS — H52223 Regular astigmatism, bilateral: Secondary | ICD-10-CM | POA: Diagnosis not present

## 2017-11-22 DIAGNOSIS — H524 Presbyopia: Secondary | ICD-10-CM | POA: Diagnosis not present

## 2017-11-22 DIAGNOSIS — H401131 Primary open-angle glaucoma, bilateral, mild stage: Secondary | ICD-10-CM | POA: Diagnosis not present

## 2017-11-27 IMAGING — NM NM BONE WHOLE BODY
2 series · 2 of 2 positions shown · non-contrast
Comparison: Chest x-ray dated December 06, 2015

CLINICAL DATA: Prostate malignancy evaluate for metastatic disease
; recent chest x-ray demonstrating increased density of the anterior
aspect of the left seventh rib.

EXAM:
NUCLEAR MEDICINE WHOLE BODY BONE SCAN
TECHNIQUE: Whole body anterior and posterior images were obtained approximately
3 hours after intravenous injection of radiopharmaceutical.
RADIOPHARMACEUTICALS:  26.3 mCi Oechnetium-KKm MDP IV

[Series 1: whole body · 2.66mm/px · 1 of 1 slices shown (1 of 2)]
[im 1/1]
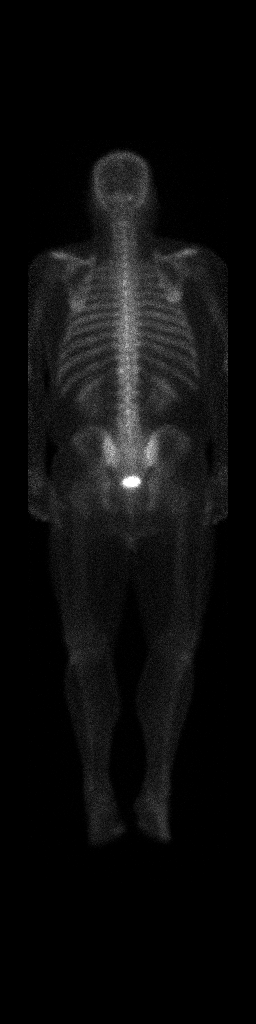

[Series 1: whole body · 2.66mm/px · 1 of 1 slices shown (2 of 2)]
[im 1/1]
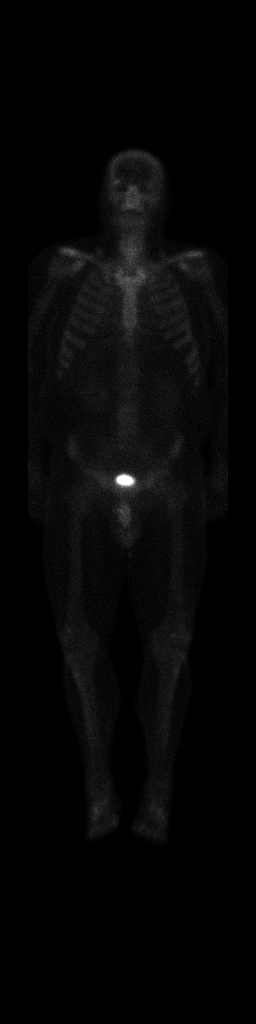

[2 of 2 positions shown; findings below may reference images not displayed]

FINDINGS: There is adequate uptake of the radiopharmaceutical by the skeleton.
There is adequate soft tissue clearance and renal activity.

There is a tiny focus of increased uptake in the inferior aspect of
the right occipital bone.

Activity within the spine, ribs, pelvis, is normal. Specific
attention to the seventh ribs reveals no abnormally increased
uptake.

Activity over the upper and lower extremities is within the limits
of normal.
IMPRESSION: 1. No abnormal uptake in the left seventh rib or elsewhere in the
ribs to suggest metastatic disease. The finding on the chest x-ray
likely reflected overlap of other normal structures.
2. Small focus of increased uptake in the inferior aspect of the
right occipital bone is nonspecific. A skull series could be
obtained to evaluate this more completely.

## 2017-12-02 MED FILL — LUMIGAN 0.01% EYE DROPS: 0.01 | 90 days supply | Qty: 8 | Fill #5

## 2017-12-02 MED FILL — DORZOLAMIDE-TIMOLOL EYE DRP: 22.3-6.8 | 50 days supply | Qty: 10 | Fill #0

## 2017-12-07 MED FILL — AMLODIPINE BESYLATE 5 MG TA: 5 | 90 days supply | Qty: 90 | Fill #0

## 2017-12-28 DIAGNOSIS — Z0000 Encounter for general adult medical examination without abnormal findings: Secondary | ICD-10-CM | POA: Diagnosis not present

## 2017-12-28 DIAGNOSIS — E119 Type 2 diabetes mellitus without complications: Secondary | ICD-10-CM | POA: Diagnosis not present

## 2017-12-28 DIAGNOSIS — Z125 Encounter for screening for malignant neoplasm of prostate: Secondary | ICD-10-CM | POA: Diagnosis not present

## 2017-12-29 DIAGNOSIS — Z0000 Encounter for general adult medical examination without abnormal findings: Secondary | ICD-10-CM | POA: Diagnosis not present

## 2017-12-29 DIAGNOSIS — E119 Type 2 diabetes mellitus without complications: Secondary | ICD-10-CM | POA: Diagnosis not present

## 2017-12-29 DIAGNOSIS — I10 Essential (primary) hypertension: Secondary | ICD-10-CM | POA: Diagnosis not present

## 2018-01-07 MED FILL — LISINOPRIL 2.5 MG TABLET: 2.5 | 30 days supply | Qty: 30 | Fill #0

## 2018-01-07 MED FILL — METFORMIN HCL ER 500 MG TAB: 500 | 90 days supply | Qty: 360 | Fill #0

## 2018-01-17 MED FILL — TIMOLOL 0.5% EYE DROPS: 0.5 | 25 days supply | Qty: 5 | Fill #7

## 2018-01-19 MED FILL — glipiZIDE ER 5 MG TB24: 5 | 90 days supply | Qty: 90 | Fill #2

## 2018-02-09 MED FILL — DORZOLAMIDE-TIMOLOL EYE DRP: 22.3-6.8 | 50 days supply | Qty: 10 | Fill #1

## 2018-02-17 DIAGNOSIS — Z85828 Personal history of other malignant neoplasm of skin: Secondary | ICD-10-CM | POA: Diagnosis not present

## 2018-02-17 DIAGNOSIS — D171 Benign lipomatous neoplasm of skin and subcutaneous tissue of trunk: Secondary | ICD-10-CM | POA: Diagnosis not present

## 2018-02-17 DIAGNOSIS — D225 Melanocytic nevi of trunk: Secondary | ICD-10-CM | POA: Diagnosis not present

## 2018-02-17 DIAGNOSIS — Z85820 Personal history of malignant melanoma of skin: Secondary | ICD-10-CM | POA: Diagnosis not present

## 2018-02-17 DIAGNOSIS — D1801 Hemangioma of skin and subcutaneous tissue: Secondary | ICD-10-CM | POA: Diagnosis not present

## 2018-02-17 DIAGNOSIS — D2272 Melanocytic nevi of left lower limb, including hip: Secondary | ICD-10-CM | POA: Diagnosis not present

## 2018-02-17 DIAGNOSIS — L570 Actinic keratosis: Secondary | ICD-10-CM | POA: Diagnosis not present

## 2018-02-17 DIAGNOSIS — L821 Other seborrheic keratosis: Secondary | ICD-10-CM | POA: Diagnosis not present

## 2018-02-17 DIAGNOSIS — Z8546 Personal history of malignant neoplasm of prostate: Secondary | ICD-10-CM | POA: Diagnosis not present

## 2018-03-30 MED FILL — DORZOLAMIDE-TIMOLOL EYE DRP: 22.3-6.8 | 50 days supply | Qty: 10 | Fill #2

## 2018-04-01 MED FILL — LUMIGAN 0.01% EYE DROPS: 0.01 | 30 days supply | Qty: 3 | Fill #0

## 2018-04-13 MED FILL — glipiZIDE ER 5 MG TB24: 5 | 90 days supply | Qty: 90 | Fill #3

## 2018-04-13 MED FILL — METFORMIN HCL ER 500 MG TAB: 500 | 90 days supply | Qty: 360 | Fill #1

## 2018-05-18 MED FILL — TIMOLOL 0.5% EYE DROPS: 0.5 | 25 days supply | Qty: 5 | Fill #0

## 2018-05-18 MED FILL — DORZOLAMIDE HCL 2% EYE DRP: 2 | 50 days supply | Qty: 10 | Fill #0

## 2018-05-19 DIAGNOSIS — Z8546 Personal history of malignant neoplasm of prostate: Secondary | ICD-10-CM | POA: Diagnosis not present

## 2018-05-19 MED FILL — DORZOLAMIDE-TIMOLOL EYE DRP: 22.3-6.8 | 50 days supply | Qty: 10 | Fill #3

## 2018-05-25 DIAGNOSIS — Z8546 Personal history of malignant neoplasm of prostate: Secondary | ICD-10-CM | POA: Diagnosis not present

## 2018-06-02 MED FILL — LUMIGAN 0.01% EYE DROPS: 0.01 | 30 days supply | Qty: 3 | Fill #1

## 2018-07-08 MED FILL — DORZOLAMIDE-TIMOLOL EYE DRP: 22.3-6.8 | 50 days supply | Qty: 10 | Fill #0

## 2018-07-08 MED FILL — LUMIGAN 0.01% EYE DROPS: 0.01 | 30 days supply | Qty: 3 | Fill #2

## 2018-07-12 MED FILL — glipiZIDE ER 5 MG TB24: 5 | 90 days supply | Qty: 90 | Fill #0

## 2018-07-12 MED FILL — METFORMIN HCL ER 500 MG TAB: 500 | 90 days supply | Qty: 360 | Fill #2

## 2018-07-26 MED FILL — SHINGRIX 50 MCG SUS: 50 | 1 days supply | Qty: 1 | Fill #0

## 2018-08-10 MED FILL — LUMIGAN 0.01% EYE DROPS: 0.01 | 25 days supply | Qty: 3 | Fill #3

## 2018-08-25 DIAGNOSIS — D0439 Carcinoma in situ of skin of other parts of face: Secondary | ICD-10-CM | POA: Diagnosis not present

## 2018-08-25 DIAGNOSIS — D171 Benign lipomatous neoplasm of skin and subcutaneous tissue of trunk: Secondary | ICD-10-CM | POA: Diagnosis not present

## 2018-08-25 DIAGNOSIS — Z85820 Personal history of malignant melanoma of skin: Secondary | ICD-10-CM | POA: Diagnosis not present

## 2018-08-25 DIAGNOSIS — L918 Other hypertrophic disorders of the skin: Secondary | ICD-10-CM | POA: Diagnosis not present

## 2018-08-25 DIAGNOSIS — D485 Neoplasm of uncertain behavior of skin: Secondary | ICD-10-CM | POA: Diagnosis not present

## 2018-08-25 DIAGNOSIS — D2271 Melanocytic nevi of right lower limb, including hip: Secondary | ICD-10-CM | POA: Diagnosis not present

## 2018-08-25 DIAGNOSIS — D225 Melanocytic nevi of trunk: Secondary | ICD-10-CM | POA: Diagnosis not present

## 2018-08-25 DIAGNOSIS — Z85828 Personal history of other malignant neoplasm of skin: Secondary | ICD-10-CM | POA: Diagnosis not present

## 2018-08-25 DIAGNOSIS — L570 Actinic keratosis: Secondary | ICD-10-CM | POA: Diagnosis not present

## 2018-08-25 DIAGNOSIS — D2262 Melanocytic nevi of left upper limb, including shoulder: Secondary | ICD-10-CM | POA: Diagnosis not present

## 2018-08-25 DIAGNOSIS — C44319 Basal cell carcinoma of skin of other parts of face: Secondary | ICD-10-CM | POA: Diagnosis not present

## 2018-09-15 MED FILL — LISINOPRIL 2.5 MG TABLET: 2.5 | 30 days supply | Qty: 30 | Fill #1

## 2018-09-22 MED FILL — SHINGRIX 50 MCG SUS: 50 | 1 days supply | Qty: 1 | Fill #1

## 2018-10-03 MED FILL — LUMIGAN 0.01% EYE DROPS: 0.01 | 25 days supply | Qty: 3 | Fill #0

## 2018-10-04 DIAGNOSIS — C44329 Squamous cell carcinoma of skin of other parts of face: Secondary | ICD-10-CM | POA: Diagnosis not present

## 2018-10-04 DIAGNOSIS — Z85828 Personal history of other malignant neoplasm of skin: Secondary | ICD-10-CM | POA: Diagnosis not present

## 2018-10-04 DIAGNOSIS — C44319 Basal cell carcinoma of skin of other parts of face: Secondary | ICD-10-CM | POA: Diagnosis not present

## 2018-10-04 MED FILL — DOXYCYCLINE HYCLATE 100 MG: 100 | 5 days supply | Qty: 10 | Fill #0

## 2018-10-10 MED FILL — glipiZIDE ER 5 MG TB24: 5 | 90 days supply | Qty: 90 | Fill #1

## 2018-10-11 DIAGNOSIS — L570 Actinic keratosis: Secondary | ICD-10-CM | POA: Diagnosis not present

## 2018-10-11 MED FILL — metFORMIN HCL ER 500 MG TB2: 500 | 90 days supply | Qty: 360 | Fill #3

## 2018-10-11 MED FILL — LISINOPRIL 2.5 MG TABLET: 2.5 | 30 days supply | Qty: 30 | Fill #2

## 2018-10-27 MED FILL — TIMOLOL 0.5% EYE DROPS: 0.5 | 25 days supply | Qty: 5 | Fill #1

## 2018-11-08 MED FILL — LUMIGAN 0.01% EYE DROPS: 0.01 | 25 days supply | Qty: 3 | Fill #1

## 2018-11-13 MED FILL — LISINOPRIL 2.5 MG TABLET: 2.5 | 30 days supply | Qty: 30 | Fill #3

## 2018-11-23 DIAGNOSIS — Z8546 Personal history of malignant neoplasm of prostate: Secondary | ICD-10-CM | POA: Diagnosis not present

## 2018-11-30 DIAGNOSIS — N400 Enlarged prostate without lower urinary tract symptoms: Secondary | ICD-10-CM | POA: Diagnosis not present

## 2018-11-30 DIAGNOSIS — Z8546 Personal history of malignant neoplasm of prostate: Secondary | ICD-10-CM | POA: Diagnosis not present

## 2018-12-05 MED FILL — TIMOLOL 0.5% EYE DROPS: 0.5 | 25 days supply | Qty: 5 | Fill #2

## 2018-12-05 MED FILL — LUMIGAN 0.01% EYE DROPS: 0.01 | 25 days supply | Qty: 3 | Fill #2

## 2018-12-11 MED FILL — LISINOPRIL 2.5 MG TABLET: 2.5 | 30 days supply | Qty: 30 | Fill #4

## 2019-01-02 MED FILL — AMLODIPINE BESYLATE 5 MG TA: 5 | 30 days supply | Qty: 30 | Fill #0

## 2019-01-08 MED FILL — glipiZIDE ER 5 MG TB24: 5 | 90 days supply | Qty: 90 | Fill #2

## 2019-01-09 MED FILL — DORZOLAMIDE-TIMOLOL EYE DRP: 22.3-6.8 | 50 days supply | Qty: 10 | Fill #1

## 2019-01-09 MED FILL — LUMIGAN 0.01% EYE DROPS: 0.01 | 25 days supply | Qty: 3 | Fill #3

## 2019-01-10 MED FILL — metFORMIN HCL ER 500 MG TB2: 500 | 90 days supply | Qty: 360 | Fill #0

## 2019-01-31 DIAGNOSIS — E119 Type 2 diabetes mellitus without complications: Secondary | ICD-10-CM | POA: Diagnosis not present

## 2019-01-31 DIAGNOSIS — E7800 Pure hypercholesterolemia, unspecified: Secondary | ICD-10-CM | POA: Diagnosis not present

## 2019-01-31 DIAGNOSIS — I10 Essential (primary) hypertension: Secondary | ICD-10-CM | POA: Diagnosis not present

## 2019-02-07 DIAGNOSIS — E7800 Pure hypercholesterolemia, unspecified: Secondary | ICD-10-CM | POA: Diagnosis not present

## 2019-02-07 DIAGNOSIS — I10 Essential (primary) hypertension: Secondary | ICD-10-CM | POA: Diagnosis not present

## 2019-02-07 DIAGNOSIS — E119 Type 2 diabetes mellitus without complications: Secondary | ICD-10-CM | POA: Diagnosis not present

## 2019-02-07 MED FILL — LISINOPRIL 2.5 MG TABLET: 2.5 | 90 days supply | Qty: 90 | Fill #0

## 2019-02-07 MED FILL — AMLODIPINE BESYLATE 5 MG TA: 5 | 90 days supply | Qty: 90 | Fill #0

## 2019-02-13 MED FILL — glipiZIDE ER 5 MG TB24: 5 | 90 days supply | Qty: 180 | Fill #0

## 2019-02-14 MED FILL — LUMIGAN 0.01% EYE DROPS: 0.01 | 25 days supply | Qty: 3 | Fill #0

## 2019-02-23 MED FILL — DORZOLAMIDE-TIMOLOL EYE DRP: 22.3-6.8 | 50 days supply | Qty: 10 | Fill #2

## 2019-03-01 DIAGNOSIS — Z85820 Personal history of malignant melanoma of skin: Secondary | ICD-10-CM | POA: Diagnosis not present

## 2019-03-01 DIAGNOSIS — D171 Benign lipomatous neoplasm of skin and subcutaneous tissue of trunk: Secondary | ICD-10-CM | POA: Diagnosis not present

## 2019-03-01 DIAGNOSIS — L570 Actinic keratosis: Secondary | ICD-10-CM | POA: Diagnosis not present

## 2019-03-01 DIAGNOSIS — Z85828 Personal history of other malignant neoplasm of skin: Secondary | ICD-10-CM | POA: Diagnosis not present

## 2019-03-01 DIAGNOSIS — D2272 Melanocytic nevi of left lower limb, including hip: Secondary | ICD-10-CM | POA: Diagnosis not present

## 2019-03-01 DIAGNOSIS — D225 Melanocytic nevi of trunk: Secondary | ICD-10-CM | POA: Diagnosis not present

## 2019-03-01 DIAGNOSIS — L821 Other seborrheic keratosis: Secondary | ICD-10-CM | POA: Diagnosis not present

## 2019-03-01 DIAGNOSIS — D2271 Melanocytic nevi of right lower limb, including hip: Secondary | ICD-10-CM | POA: Diagnosis not present

## 2019-03-09 DIAGNOSIS — H5211 Myopia, right eye: Secondary | ICD-10-CM | POA: Diagnosis not present

## 2019-03-09 DIAGNOSIS — H401131 Primary open-angle glaucoma, bilateral, mild stage: Secondary | ICD-10-CM | POA: Diagnosis not present

## 2019-03-09 DIAGNOSIS — H524 Presbyopia: Secondary | ICD-10-CM | POA: Diagnosis not present

## 2019-03-09 DIAGNOSIS — H52223 Regular astigmatism, bilateral: Secondary | ICD-10-CM | POA: Diagnosis not present

## 2019-03-09 MED FILL — LUMIGAN 0.01% EYE DROPS: 0.01 | 25 days supply | Qty: 3 | Fill #0

## 2019-04-18 MED FILL — DORZOLAMIDE-TIMOLOL EYE DRP: 22.3-6.8 | 50 days supply | Qty: 10 | Fill #3

## 2019-04-18 MED FILL — LUMIGAN 0.01% EYE DROPS: 0.01 | 25 days supply | Qty: 3 | Fill #1

## 2019-05-15 MED FILL — LISINOPRIL 2.5 MG TABLET: 2.5 | 90 days supply | Qty: 90 | Fill #1

## 2019-05-16 MED FILL — metFORMIN HCL ER 500 MG TB2: 500 | 90 days supply | Qty: 360 | Fill #0

## 2019-05-16 MED FILL — LUMIGAN 0.01% EYE DROPS: 0.01 | 25 days supply | Qty: 3 | Fill #2

## 2019-06-05 MED FILL — glipiZIDE ER 5 MG TB24: 5 | 90 days supply | Qty: 180 | Fill #1

## 2019-06-12 MED FILL — DORZOLAMIDE-TIMOLOL EYE DRP: 22.3-6.8 | 50 days supply | Qty: 10 | Fill #0

## 2019-06-12 MED FILL — LUMIGAN 0.01% EYE DROPS: 0.01 | 25 days supply | Qty: 3 | Fill #3

## 2019-07-10 DIAGNOSIS — N400 Enlarged prostate without lower urinary tract symptoms: Secondary | ICD-10-CM | POA: Diagnosis not present

## 2019-07-10 MED FILL — LUMIGAN 0.01% EYE DROPS: 0.01 | 25 days supply | Qty: 3 | Fill #4

## 2019-07-25 DIAGNOSIS — R51 Headache: Secondary | ICD-10-CM | POA: Diagnosis not present

## 2019-07-25 DIAGNOSIS — Z20828 Contact with and (suspected) exposure to other viral communicable diseases: Secondary | ICD-10-CM | POA: Diagnosis not present

## 2019-07-25 DIAGNOSIS — J0100 Acute maxillary sinusitis, unspecified: Secondary | ICD-10-CM | POA: Diagnosis not present

## 2019-07-25 DIAGNOSIS — Z7189 Other specified counseling: Secondary | ICD-10-CM | POA: Diagnosis not present

## 2019-07-25 DIAGNOSIS — R509 Fever, unspecified: Secondary | ICD-10-CM | POA: Diagnosis not present

## 2019-07-25 DIAGNOSIS — J029 Acute pharyngitis, unspecified: Secondary | ICD-10-CM | POA: Diagnosis not present

## 2019-07-25 DIAGNOSIS — J3489 Other specified disorders of nose and nasal sinuses: Secondary | ICD-10-CM | POA: Diagnosis not present

## 2019-07-25 MED FILL — AMOX-CLAV 875-125 MG TABLET: 875-125 | 10 days supply | Qty: 20 | Fill #0

## 2019-07-31 MED FILL — DORZOLAMIDE-TIMOLOL EYE DRP: 22.3-6.8 | 50 days supply | Qty: 10 | Fill #1

## 2019-08-07 MED FILL — LUMIGAN 0.01% EYE DROPS: 0.01 | 25 days supply | Qty: 3 | Fill #1

## 2019-08-11 DIAGNOSIS — Z03818 Encounter for observation for suspected exposure to other biological agents ruled out: Secondary | ICD-10-CM | POA: Diagnosis not present

## 2019-08-17 MED FILL — metFORMIN HCL ER 500 MG TB2: 500 | 30 days supply | Qty: 120 | Fill #1

## 2019-08-17 MED FILL — LISINOPRIL 2.5 MG TABLET: 2.5 | 90 days supply | Qty: 90 | Fill #2

## 2019-08-17 MED FILL — AMLODIPINE BESYLATE 5 MG TA: 5 | 90 days supply | Qty: 90 | Fill #1

## 2019-08-25 MED FILL — glipiZIDE ER 5 MG TB24: 5 | 90 days supply | Qty: 180 | Fill #2

## 2019-09-01 DIAGNOSIS — Z85820 Personal history of malignant melanoma of skin: Secondary | ICD-10-CM | POA: Diagnosis not present

## 2019-09-01 DIAGNOSIS — Z85828 Personal history of other malignant neoplasm of skin: Secondary | ICD-10-CM | POA: Diagnosis not present

## 2019-09-01 DIAGNOSIS — D171 Benign lipomatous neoplasm of skin and subcutaneous tissue of trunk: Secondary | ICD-10-CM | POA: Diagnosis not present

## 2019-09-01 DIAGNOSIS — D2262 Melanocytic nevi of left upper limb, including shoulder: Secondary | ICD-10-CM | POA: Diagnosis not present

## 2019-09-01 DIAGNOSIS — D225 Melanocytic nevi of trunk: Secondary | ICD-10-CM | POA: Diagnosis not present

## 2019-09-01 DIAGNOSIS — D2272 Melanocytic nevi of left lower limb, including hip: Secondary | ICD-10-CM | POA: Diagnosis not present

## 2019-09-01 DIAGNOSIS — L821 Other seborrheic keratosis: Secondary | ICD-10-CM | POA: Diagnosis not present

## 2019-09-01 DIAGNOSIS — L570 Actinic keratosis: Secondary | ICD-10-CM | POA: Diagnosis not present

## 2019-09-04 MED FILL — glipiZIDE ER 5 MG TB24: 5 | 90 days supply | Qty: 180 | Fill #2

## 2019-09-19 MED FILL — DORZOLAMIDE HCL-TIMOLOL MAL: 22.3-6.8 | 50 days supply | Qty: 10 | Fill #2

## 2019-09-19 MED FILL — LUMIGAN 0.01% EYE DROPS: 0.01 | 25 days supply | Qty: 3 | Fill #2

## 2019-10-07 MED FILL — metFORMIN HCL ER 500 MG TB2: 500 | 30 days supply | Qty: 120 | Fill #2

## 2019-10-16 MED FILL — LUMIGAN 0.01% EYE DROPS: 0.01 | 25 days supply | Qty: 3 | Fill #3

## 2019-11-02 MED FILL — metFORMIN HCL ER 500 MG TB2: 500 | 90 days supply | Qty: 360 | Fill #3

## 2019-11-14 MED FILL — DORZOLAMIDE HCL-TIMOLOL MAL: 22.3-6.8 | 50 days supply | Qty: 10 | Fill #3

## 2019-11-15 MED FILL — LUMIGAN 0.01% EYE DROPS: 0.01 | 25 days supply | Qty: 3 | Fill #0

## 2019-11-28 DIAGNOSIS — N400 Enlarged prostate without lower urinary tract symptoms: Secondary | ICD-10-CM | POA: Diagnosis not present

## 2019-12-04 DIAGNOSIS — N400 Enlarged prostate without lower urinary tract symptoms: Secondary | ICD-10-CM | POA: Diagnosis not present

## 2019-12-04 DIAGNOSIS — Z8546 Personal history of malignant neoplasm of prostate: Secondary | ICD-10-CM | POA: Diagnosis not present

## 2019-12-05 MED FILL — LUMIGAN 0.01% EYE DROPS: 0.01 | 25 days supply | Qty: 3 | Fill #0

## 2019-12-11 MED FILL — AMLODIPINE BESYLATE 5 MG TA: 5 | 90 days supply | Qty: 90 | Fill #2

## 2019-12-11 MED FILL — glipiZIDE ER 5 MG TB24: 5 | 90 days supply | Qty: 180 | Fill #3

## 2019-12-26 MED FILL — DORZOLAMIDE HCL-TIMOLOL MAL: 22.3-6.8 | 50 days supply | Qty: 10 | Fill #4

## 2020-01-29 MED FILL — LISINOPRIL 2.5 MG TABLET: 2.5 | 90 days supply | Qty: 90 | Fill #3

## 2020-01-29 MED FILL — metFORMIN HCL ER 500 MG TB2: 500 | 90 days supply | Qty: 360 | Fill #4

## 2020-02-05 MED FILL — LUMIGAN 0.01% EYE DROPS: 0.01 | 25 days supply | Qty: 3 | Fill #1

## 2020-02-05 MED FILL — DORZOLAMIDE-TIMOLOL EYE DRP: 22.3-6.8 | 50 days supply | Qty: 10 | Fill #0

## 2020-02-13 ENCOUNTER — Other Ambulatory Visit (HOSPITAL_COMMUNITY): Payer: Self-pay | Admitting: Family Medicine

## 2020-02-13 DIAGNOSIS — Z1159 Encounter for screening for other viral diseases: Secondary | ICD-10-CM | POA: Diagnosis not present

## 2020-02-13 DIAGNOSIS — I10 Essential (primary) hypertension: Secondary | ICD-10-CM | POA: Diagnosis not present

## 2020-02-13 DIAGNOSIS — E786 Lipoprotein deficiency: Secondary | ICD-10-CM | POA: Diagnosis not present

## 2020-02-13 DIAGNOSIS — E119 Type 2 diabetes mellitus without complications: Secondary | ICD-10-CM | POA: Diagnosis not present

## 2020-02-26 DIAGNOSIS — Z961 Presence of intraocular lens: Secondary | ICD-10-CM | POA: Diagnosis not present

## 2020-02-26 DIAGNOSIS — H401131 Primary open-angle glaucoma, bilateral, mild stage: Secondary | ICD-10-CM | POA: Diagnosis not present

## 2020-02-26 DIAGNOSIS — E119 Type 2 diabetes mellitus without complications: Secondary | ICD-10-CM | POA: Diagnosis not present

## 2020-02-26 MED FILL — LUMIGAN 0.01% EYE DROPS: 0.01 | 25 days supply | Qty: 3 | Fill #0

## 2020-02-29 DIAGNOSIS — L570 Actinic keratosis: Secondary | ICD-10-CM | POA: Diagnosis not present

## 2020-02-29 DIAGNOSIS — D2272 Melanocytic nevi of left lower limb, including hip: Secondary | ICD-10-CM | POA: Diagnosis not present

## 2020-02-29 DIAGNOSIS — D225 Melanocytic nevi of trunk: Secondary | ICD-10-CM | POA: Diagnosis not present

## 2020-02-29 DIAGNOSIS — D2271 Melanocytic nevi of right lower limb, including hip: Secondary | ICD-10-CM | POA: Diagnosis not present

## 2020-02-29 DIAGNOSIS — D171 Benign lipomatous neoplasm of skin and subcutaneous tissue of trunk: Secondary | ICD-10-CM | POA: Diagnosis not present

## 2020-02-29 DIAGNOSIS — Z85820 Personal history of malignant melanoma of skin: Secondary | ICD-10-CM | POA: Diagnosis not present

## 2020-02-29 DIAGNOSIS — L821 Other seborrheic keratosis: Secondary | ICD-10-CM | POA: Diagnosis not present

## 2020-02-29 DIAGNOSIS — L814 Other melanin hyperpigmentation: Secondary | ICD-10-CM | POA: Diagnosis not present

## 2020-02-29 DIAGNOSIS — Z85828 Personal history of other malignant neoplasm of skin: Secondary | ICD-10-CM | POA: Diagnosis not present

## 2020-02-29 MED FILL — FLUOROURACIL 5 % CREA: 5 | 14 days supply | Qty: 40 | Fill #0

## 2020-03-13 MED FILL — GLIPIZIDE ER 5 MG TB24: 5 | 90 days supply | Qty: 180 | Fill #0

## 2020-03-22 MED FILL — DORZOLAMIDE HCL-TIMOLOL MAL: 22.3-6.8 | 50 days supply | Qty: 10 | Fill #0

## 2020-03-22 MED FILL — LUMIGAN 0.01% EYE DROPS: 0.01 | 25 days supply | Qty: 3 | Fill #0

## 2020-05-28 DIAGNOSIS — Z8546 Personal history of malignant neoplasm of prostate: Secondary | ICD-10-CM | POA: Diagnosis not present

## 2020-06-06 MED FILL — DORZOLAMIDE HCL-TIMOLOL MAL: 22.3-6.8 | 50 days supply | Qty: 10 | Fill #1

## 2020-06-07 MED FILL — LUMIGAN 0.01% EYE DROPS: 0.01 | 25 days supply | Qty: 3 | Fill #2

## 2020-06-13 MED FILL — GLIPIZIDE ER 5 MG TB24: 5 | 90 days supply | Qty: 180 | Fill #1

## 2020-07-17 DIAGNOSIS — E119 Type 2 diabetes mellitus without complications: Secondary | ICD-10-CM | POA: Diagnosis not present

## 2020-07-17 DIAGNOSIS — E786 Lipoprotein deficiency: Secondary | ICD-10-CM | POA: Diagnosis not present

## 2020-07-22 MED FILL — DORZOLAMIDE HCL-TIMOLOL MAL: 22.3-6.8 | 50 days supply | Qty: 10 | Fill #2

## 2020-07-22 MED FILL — LUMIGAN 0.01% EYE DROPS: 0.01 | 25 days supply | Qty: 3 | Fill #3

## 2020-08-06 MED FILL — metFORMIN HCL ER 500 MG TB2: 500 | 90 days supply | Qty: 360 | Fill #1

## 2020-08-08 MED FILL — LISINOPRIL 2.5 MG TABLET: 2.5 | 90 days supply | Qty: 90 | Fill #1

## 2020-08-27 MED FILL — AMLODIPINE BESYLATE 5 MG TA: 5 | 90 days supply | Qty: 90 | Fill #1

## 2020-08-31 MED FILL — DORZOLAMIDE HCL-TIMOLOL MAL: 22.3-6.8 | 50 days supply | Qty: 10 | Fill #3

## 2020-08-31 MED FILL — LUMIGAN 0.01% EYE DROPS: 0.01 | 25 days supply | Qty: 3 | Fill #4

## 2020-09-03 DIAGNOSIS — D2271 Melanocytic nevi of right lower limb, including hip: Secondary | ICD-10-CM | POA: Diagnosis not present

## 2020-09-03 DIAGNOSIS — L570 Actinic keratosis: Secondary | ICD-10-CM | POA: Diagnosis not present

## 2020-09-03 DIAGNOSIS — L814 Other melanin hyperpigmentation: Secondary | ICD-10-CM | POA: Diagnosis not present

## 2020-09-03 DIAGNOSIS — L821 Other seborrheic keratosis: Secondary | ICD-10-CM | POA: Diagnosis not present

## 2020-09-03 DIAGNOSIS — Z85828 Personal history of other malignant neoplasm of skin: Secondary | ICD-10-CM | POA: Diagnosis not present

## 2020-09-03 DIAGNOSIS — Z85820 Personal history of malignant melanoma of skin: Secondary | ICD-10-CM | POA: Diagnosis not present

## 2020-09-03 DIAGNOSIS — D2272 Melanocytic nevi of left lower limb, including hip: Secondary | ICD-10-CM | POA: Diagnosis not present

## 2020-09-03 DIAGNOSIS — D171 Benign lipomatous neoplasm of skin and subcutaneous tissue of trunk: Secondary | ICD-10-CM | POA: Diagnosis not present

## 2020-09-03 DIAGNOSIS — D225 Melanocytic nevi of trunk: Secondary | ICD-10-CM | POA: Diagnosis not present

## 2020-09-03 MED FILL — FLUOROURACIL 5 % CREA: 5 | 14 days supply | Qty: 40 | Fill #0

## 2020-09-09 MED FILL — GLIPIZIDE ER 5 MG TB24: 5 | 90 days supply | Qty: 180 | Fill #2

## 2020-10-17 MED FILL — DORZOLAMIDE HCL-TIMOLOL MAL: 22.3-6.8 | 50 days supply | Qty: 10 | Fill #4

## 2020-10-21 MED FILL — LUMIGAN 0.01% EYE DROPS: 0.01 | 25 days supply | Qty: 3 | Fill #2

## 2020-11-04 MED FILL — LISINOPRIL 2.5 MG TABLET: 2.5 | 90 days supply | Qty: 90 | Fill #2

## 2020-11-04 MED FILL — metFORMIN HCL ER 500 MG TB2: 500 | 90 days supply | Qty: 360 | Fill #2

## 2020-11-26 DIAGNOSIS — Z8546 Personal history of malignant neoplasm of prostate: Secondary | ICD-10-CM | POA: Diagnosis not present

## 2020-12-02 ENCOUNTER — Other Ambulatory Visit (HOSPITAL_COMMUNITY): Payer: Self-pay | Admitting: *Deleted

## 2020-12-02 MED FILL — DORZOLAMIDE HCL-TIMOLOL MAL: 22.3-6.8 | 50 days supply | Qty: 10 | Fill #0

## 2020-12-03 ENCOUNTER — Other Ambulatory Visit (HOSPITAL_COMMUNITY): Payer: Self-pay | Admitting: *Deleted

## 2020-12-03 DIAGNOSIS — Z8546 Personal history of malignant neoplasm of prostate: Secondary | ICD-10-CM | POA: Diagnosis not present

## 2020-12-03 DIAGNOSIS — R3915 Urgency of urination: Secondary | ICD-10-CM | POA: Diagnosis not present

## 2020-12-03 MED FILL — LUMIGAN 0.01% EYE DROPS: 0.01 | 25 days supply | Qty: 3 | Fill #0

## 2020-12-06 MED FILL — GLIPIZIDE ER 5 MG TB24: 5 | 90 days supply | Qty: 180 | Fill #3

## 2020-12-12 MED FILL — AMLODIPINE BESYLATE 5 MG TA: 5 | 90 days supply | Qty: 90 | Fill #2

## 2021-01-20 MED FILL — DORZOLAMIDE HCL-TIMOLOL MAL: 22.3-6.8 | 50 days supply | Qty: 10 | Fill #1

## 2021-01-20 MED FILL — LUMIGAN 0.01% EYE DROPS: 0.01 | 25 days supply | Qty: 3 | Fill #1

## 2021-01-30 MED FILL — LISINOPRIL 2.5 MG TABLET: 2.5 | 90 days supply | Qty: 90 | Fill #3

## 2021-02-03 MED FILL — metFORMIN HCL ER 500 MG TB2: 500 | 90 days supply | Qty: 360 | Fill #3

## 2021-03-04 ENCOUNTER — Other Ambulatory Visit (HOSPITAL_COMMUNITY): Payer: Self-pay | Admitting: Family Medicine

## 2021-03-05 DIAGNOSIS — D2261 Melanocytic nevi of right upper limb, including shoulder: Secondary | ICD-10-CM | POA: Diagnosis not present

## 2021-03-05 DIAGNOSIS — D225 Melanocytic nevi of trunk: Secondary | ICD-10-CM | POA: Diagnosis not present

## 2021-03-05 DIAGNOSIS — Z85828 Personal history of other malignant neoplasm of skin: Secondary | ICD-10-CM | POA: Diagnosis not present

## 2021-03-05 DIAGNOSIS — L570 Actinic keratosis: Secondary | ICD-10-CM | POA: Diagnosis not present

## 2021-03-05 DIAGNOSIS — D2271 Melanocytic nevi of right lower limb, including hip: Secondary | ICD-10-CM | POA: Diagnosis not present

## 2021-03-05 DIAGNOSIS — D171 Benign lipomatous neoplasm of skin and subcutaneous tissue of trunk: Secondary | ICD-10-CM | POA: Diagnosis not present

## 2021-03-05 DIAGNOSIS — D2272 Melanocytic nevi of left lower limb, including hip: Secondary | ICD-10-CM | POA: Diagnosis not present

## 2021-03-05 DIAGNOSIS — L821 Other seborrheic keratosis: Secondary | ICD-10-CM | POA: Diagnosis not present

## 2021-03-05 DIAGNOSIS — Z85820 Personal history of malignant melanoma of skin: Secondary | ICD-10-CM | POA: Diagnosis not present

## 2021-03-28 DIAGNOSIS — E119 Type 2 diabetes mellitus without complications: Secondary | ICD-10-CM | POA: Diagnosis not present

## 2021-03-28 DIAGNOSIS — Z8546 Personal history of malignant neoplasm of prostate: Secondary | ICD-10-CM | POA: Diagnosis not present

## 2021-03-28 DIAGNOSIS — Z0001 Encounter for general adult medical examination with abnormal findings: Secondary | ICD-10-CM | POA: Diagnosis not present

## 2021-03-28 DIAGNOSIS — E786 Lipoprotein deficiency: Secondary | ICD-10-CM | POA: Diagnosis not present

## 2021-03-28 DIAGNOSIS — Z125 Encounter for screening for malignant neoplasm of prostate: Secondary | ICD-10-CM | POA: Diagnosis not present

## 2021-03-28 DIAGNOSIS — Z7984 Long term (current) use of oral hypoglycemic drugs: Secondary | ICD-10-CM | POA: Diagnosis not present

## 2021-03-28 DIAGNOSIS — I10 Essential (primary) hypertension: Secondary | ICD-10-CM | POA: Diagnosis not present

## 2021-03-31 ENCOUNTER — Other Ambulatory Visit (HOSPITAL_COMMUNITY): Payer: Self-pay

## 2021-03-31 DIAGNOSIS — E119 Type 2 diabetes mellitus without complications: Secondary | ICD-10-CM | POA: Diagnosis not present

## 2021-03-31 DIAGNOSIS — I10 Essential (primary) hypertension: Secondary | ICD-10-CM | POA: Diagnosis not present

## 2021-03-31 DIAGNOSIS — Z0000 Encounter for general adult medical examination without abnormal findings: Secondary | ICD-10-CM | POA: Diagnosis not present

## 2021-03-31 DIAGNOSIS — C61 Malignant neoplasm of prostate: Secondary | ICD-10-CM | POA: Diagnosis not present

## 2021-03-31 DIAGNOSIS — E786 Lipoprotein deficiency: Secondary | ICD-10-CM | POA: Diagnosis not present

## 2021-03-31 MED ORDER — LISINOPRIL 2.5 MG PO TABS
2.5000 mg | ORAL_TABLET | Freq: Every day | ORAL | 3 refills | Status: AC
  Filled 2021-03-31 – 2021-04-01 (×2): qty 90, 90d supply, fill #0

## 2021-03-31 MED ORDER — GLIPIZIDE ER 5 MG PO TB24
5.0000 mg | ORAL_TABLET | Freq: Two times a day (BID) | ORAL | 3 refills | Status: AC
  Filled 2021-04-01: qty 180, 90d supply, fill #0

## 2021-03-31 MED ORDER — OZEMPIC (0.25 OR 0.5 MG/DOSE) 2 MG/1.5ML ~~LOC~~ SOPN
PEN_INJECTOR | SUBCUTANEOUS | 0 refills | Status: DC
  Filled 2021-03-31: qty 1.5, 56d supply, fill #0

## 2021-03-31 MED ORDER — AMLODIPINE BESYLATE 5 MG PO TABS
1.0000 | ORAL_TABLET | Freq: Every day | ORAL | 3 refills | Status: DC
  Filled 2021-03-31: qty 90, 90d supply, fill #0
  Filled 2021-06-28: qty 90, 90d supply, fill #1
  Filled 2021-10-01: qty 90, 90d supply, fill #2
  Filled 2022-01-13: qty 90, 90d supply, fill #3

## 2021-04-01 ENCOUNTER — Other Ambulatory Visit (HOSPITAL_COMMUNITY): Payer: Self-pay

## 2021-04-21 ENCOUNTER — Other Ambulatory Visit (HOSPITAL_COMMUNITY): Payer: Self-pay

## 2021-04-21 MED ORDER — OZEMPIC (0.25 OR 0.5 MG/DOSE) 2 MG/1.5ML ~~LOC~~ SOPN
PEN_INJECTOR | SUBCUTANEOUS | 3 refills | Status: DC
  Filled 2021-04-21: qty 4.5, 84d supply, fill #0
  Filled 2021-04-21 – 2021-04-22 (×2): qty 6, 84d supply, fill #0
  Filled 2021-04-22: qty 4.5, 84d supply, fill #0
  Filled 2021-07-21 (×2): qty 1.5, 28d supply, fill #1
  Filled 2021-08-17: qty 1.5, 28d supply, fill #2
  Filled 2021-09-15: qty 4.5, 84d supply, fill #3
  Filled 2021-12-02: qty 1.5, 28d supply, fill #4
  Filled 2021-12-29: qty 1.5, 28d supply, fill #5
  Filled 2022-01-18: qty 1.5, 28d supply, fill #6
  Filled 2022-02-13: qty 1.5, 28d supply, fill #7
  Filled 2022-03-08: qty 1.5, 28d supply, fill #8

## 2021-04-22 ENCOUNTER — Other Ambulatory Visit (HOSPITAL_COMMUNITY): Payer: Self-pay

## 2021-04-23 ENCOUNTER — Other Ambulatory Visit (HOSPITAL_COMMUNITY): Payer: Self-pay

## 2021-04-25 ENCOUNTER — Other Ambulatory Visit (HOSPITAL_COMMUNITY): Payer: Self-pay

## 2021-04-25 MED FILL — Bimatoprost Ophth Soln 0.01%: OPHTHALMIC | 25 days supply | Qty: 2.5 | Fill #0 | Status: AC

## 2021-04-25 MED FILL — Dorzolamide HCl-Timolol Maleate Ophth Soln 2-0.5%: OPHTHALMIC | 50 days supply | Qty: 10 | Fill #0 | Status: AC

## 2021-06-03 ENCOUNTER — Other Ambulatory Visit (HOSPITAL_COMMUNITY): Payer: Self-pay

## 2021-06-03 MED ORDER — GLIPIZIDE ER 5 MG PO TB24
5.0000 mg | ORAL_TABLET | Freq: Two times a day (BID) | ORAL | 3 refills | Status: DC
  Filled 2021-06-03: qty 180, 90d supply, fill #0
  Filled 2021-09-05: qty 180, 90d supply, fill #1
  Filled 2021-11-28: qty 180, 90d supply, fill #2
  Filled 2022-02-25: qty 180, 90d supply, fill #3

## 2021-06-03 MED ORDER — LISINOPRIL 2.5 MG PO TABS
2.5000 mg | ORAL_TABLET | Freq: Every day | ORAL | 3 refills | Status: DC
  Filled 2021-06-03: qty 90, 90d supply, fill #0
  Filled 2021-09-05: qty 90, 90d supply, fill #1
  Filled 2021-11-28: qty 90, 90d supply, fill #2
  Filled 2022-02-25: qty 90, 90d supply, fill #3

## 2021-06-04 ENCOUNTER — Other Ambulatory Visit (HOSPITAL_COMMUNITY): Payer: Self-pay

## 2021-06-13 ENCOUNTER — Other Ambulatory Visit (HOSPITAL_COMMUNITY): Payer: Self-pay

## 2021-06-13 MED FILL — Dorzolamide HCl-Timolol Maleate Ophth Soln 2-0.5%: OPHTHALMIC | 50 days supply | Qty: 10 | Fill #1 | Status: AC

## 2021-06-16 ENCOUNTER — Other Ambulatory Visit (HOSPITAL_COMMUNITY): Payer: Self-pay

## 2021-06-17 ENCOUNTER — Other Ambulatory Visit (HOSPITAL_COMMUNITY): Payer: Self-pay

## 2021-06-17 MED ORDER — LUMIGAN 0.01 % OP SOLN
OPHTHALMIC | 3 refills | Status: AC
  Filled 2021-06-17 – 2021-07-24 (×2): qty 2.5, 25d supply, fill #0

## 2021-06-25 ENCOUNTER — Other Ambulatory Visit (HOSPITAL_COMMUNITY): Payer: Self-pay

## 2021-06-28 ENCOUNTER — Other Ambulatory Visit (HOSPITAL_COMMUNITY): Payer: Self-pay

## 2021-07-11 DIAGNOSIS — E119 Type 2 diabetes mellitus without complications: Secondary | ICD-10-CM | POA: Diagnosis not present

## 2021-07-16 ENCOUNTER — Other Ambulatory Visit (HOSPITAL_COMMUNITY): Payer: Self-pay

## 2021-07-16 MED ORDER — DORZOLAMIDE HCL-TIMOLOL MAL 2-0.5 % OP SOLN
OPHTHALMIC | 0 refills | Status: DC
  Filled 2021-07-16: qty 10, 50d supply, fill #0

## 2021-07-21 ENCOUNTER — Other Ambulatory Visit (HOSPITAL_COMMUNITY): Payer: Self-pay

## 2021-07-23 ENCOUNTER — Other Ambulatory Visit (HOSPITAL_COMMUNITY): Payer: Self-pay

## 2021-07-24 ENCOUNTER — Other Ambulatory Visit (HOSPITAL_COMMUNITY): Payer: Self-pay

## 2021-07-31 ENCOUNTER — Other Ambulatory Visit (HOSPITAL_COMMUNITY): Payer: Self-pay

## 2021-07-31 DIAGNOSIS — D171 Benign lipomatous neoplasm of skin and subcutaneous tissue of trunk: Secondary | ICD-10-CM | POA: Diagnosis not present

## 2021-07-31 DIAGNOSIS — Z85828 Personal history of other malignant neoplasm of skin: Secondary | ICD-10-CM | POA: Diagnosis not present

## 2021-07-31 MED ORDER — MUPIROCIN 2 % EX OINT
TOPICAL_OINTMENT | CUTANEOUS | 0 refills | Status: AC
  Filled 2021-07-31: qty 22, 10d supply, fill #0

## 2021-08-07 ENCOUNTER — Other Ambulatory Visit (HOSPITAL_COMMUNITY): Payer: Self-pay

## 2021-08-07 DIAGNOSIS — E119 Type 2 diabetes mellitus without complications: Secondary | ICD-10-CM | POA: Diagnosis not present

## 2021-08-07 DIAGNOSIS — H401131 Primary open-angle glaucoma, bilateral, mild stage: Secondary | ICD-10-CM | POA: Diagnosis not present

## 2021-08-07 MED ORDER — DORZOLAMIDE HCL-TIMOLOL MAL 2-0.5 % OP SOLN
OPHTHALMIC | 3 refills | Status: DC
  Filled 2021-08-07: qty 10, 50d supply, fill #0
  Filled 2021-09-12: qty 10, 40d supply, fill #0
  Filled 2021-11-04: qty 10, 40d supply, fill #1
  Filled 2021-12-22: qty 10, 40d supply, fill #2
  Filled 2022-01-30: qty 10, 40d supply, fill #3
  Filled 2022-03-17: qty 10, 40d supply, fill #4
  Filled 2022-05-07: qty 10, 40d supply, fill #5

## 2021-08-07 MED ORDER — LUMIGAN 0.01 % OP SOLN
OPHTHALMIC | 3 refills | Status: DC
  Filled 2021-08-07: qty 7.5, 75d supply, fill #0
  Filled 2021-11-19: qty 7.5, 75d supply, fill #1
  Filled 2022-03-17: qty 7.5, 75d supply, fill #2

## 2021-08-13 ENCOUNTER — Other Ambulatory Visit (HOSPITAL_COMMUNITY): Payer: Self-pay

## 2021-08-14 ENCOUNTER — Other Ambulatory Visit (HOSPITAL_COMMUNITY): Payer: Self-pay

## 2021-08-18 ENCOUNTER — Other Ambulatory Visit (HOSPITAL_COMMUNITY): Payer: Self-pay

## 2021-09-05 ENCOUNTER — Other Ambulatory Visit (HOSPITAL_COMMUNITY): Payer: Self-pay

## 2021-09-12 ENCOUNTER — Other Ambulatory Visit (HOSPITAL_COMMUNITY): Payer: Self-pay

## 2021-09-15 ENCOUNTER — Other Ambulatory Visit (HOSPITAL_COMMUNITY): Payer: Self-pay

## 2021-10-01 ENCOUNTER — Other Ambulatory Visit (HOSPITAL_COMMUNITY): Payer: Self-pay

## 2021-11-04 ENCOUNTER — Other Ambulatory Visit (HOSPITAL_COMMUNITY): Payer: Self-pay

## 2021-11-05 DIAGNOSIS — D225 Melanocytic nevi of trunk: Secondary | ICD-10-CM | POA: Diagnosis not present

## 2021-11-05 DIAGNOSIS — L814 Other melanin hyperpigmentation: Secondary | ICD-10-CM | POA: Diagnosis not present

## 2021-11-05 DIAGNOSIS — L821 Other seborrheic keratosis: Secondary | ICD-10-CM | POA: Diagnosis not present

## 2021-11-05 DIAGNOSIS — Z85828 Personal history of other malignant neoplasm of skin: Secondary | ICD-10-CM | POA: Diagnosis not present

## 2021-11-05 DIAGNOSIS — L570 Actinic keratosis: Secondary | ICD-10-CM | POA: Diagnosis not present

## 2021-11-05 DIAGNOSIS — Z85820 Personal history of malignant melanoma of skin: Secondary | ICD-10-CM | POA: Diagnosis not present

## 2021-11-20 ENCOUNTER — Other Ambulatory Visit (HOSPITAL_COMMUNITY): Payer: Self-pay

## 2021-11-21 ENCOUNTER — Other Ambulatory Visit (HOSPITAL_COMMUNITY): Payer: Self-pay

## 2021-11-25 DIAGNOSIS — Z8546 Personal history of malignant neoplasm of prostate: Secondary | ICD-10-CM | POA: Diagnosis not present

## 2021-11-28 ENCOUNTER — Other Ambulatory Visit (HOSPITAL_COMMUNITY): Payer: Self-pay

## 2021-12-02 ENCOUNTER — Other Ambulatory Visit (HOSPITAL_COMMUNITY): Payer: Self-pay

## 2021-12-02 DIAGNOSIS — N400 Enlarged prostate without lower urinary tract symptoms: Secondary | ICD-10-CM | POA: Diagnosis not present

## 2021-12-22 ENCOUNTER — Other Ambulatory Visit (HOSPITAL_COMMUNITY): Payer: Self-pay

## 2021-12-29 ENCOUNTER — Other Ambulatory Visit (HOSPITAL_COMMUNITY): Payer: Self-pay

## 2022-01-13 ENCOUNTER — Other Ambulatory Visit (HOSPITAL_COMMUNITY): Payer: Self-pay

## 2022-01-19 ENCOUNTER — Other Ambulatory Visit (HOSPITAL_COMMUNITY): Payer: Self-pay

## 2022-01-20 ENCOUNTER — Other Ambulatory Visit (HOSPITAL_COMMUNITY): Payer: Self-pay

## 2022-01-23 ENCOUNTER — Other Ambulatory Visit (HOSPITAL_COMMUNITY): Payer: Self-pay

## 2022-01-30 ENCOUNTER — Other Ambulatory Visit (HOSPITAL_COMMUNITY): Payer: Self-pay

## 2022-02-13 ENCOUNTER — Other Ambulatory Visit (HOSPITAL_COMMUNITY): Payer: Self-pay

## 2022-02-25 ENCOUNTER — Other Ambulatory Visit (HOSPITAL_COMMUNITY): Payer: Self-pay

## 2022-03-09 ENCOUNTER — Other Ambulatory Visit (HOSPITAL_COMMUNITY): Payer: Self-pay

## 2022-03-09 MED ORDER — OZEMPIC (0.25 OR 0.5 MG/DOSE) 2 MG/3ML ~~LOC~~ SOPN
PEN_INJECTOR | SUBCUTANEOUS | 0 refills | Status: AC
  Filled 2022-03-09: qty 3, 28d supply, fill #0

## 2022-03-17 ENCOUNTER — Other Ambulatory Visit (HOSPITAL_COMMUNITY): Payer: Self-pay

## 2022-03-18 ENCOUNTER — Other Ambulatory Visit (HOSPITAL_COMMUNITY): Payer: Self-pay

## 2022-03-19 ENCOUNTER — Other Ambulatory Visit (HOSPITAL_COMMUNITY): Payer: Self-pay

## 2022-03-20 ENCOUNTER — Other Ambulatory Visit (HOSPITAL_COMMUNITY): Payer: Self-pay

## 2022-03-25 ENCOUNTER — Other Ambulatory Visit (HOSPITAL_COMMUNITY): Payer: Self-pay

## 2022-04-01 DIAGNOSIS — Z79899 Other long term (current) drug therapy: Secondary | ICD-10-CM | POA: Diagnosis not present

## 2022-04-01 DIAGNOSIS — E786 Lipoprotein deficiency: Secondary | ICD-10-CM | POA: Diagnosis not present

## 2022-04-01 DIAGNOSIS — Z1329 Encounter for screening for other suspected endocrine disorder: Secondary | ICD-10-CM | POA: Diagnosis not present

## 2022-04-01 DIAGNOSIS — Z85850 Personal history of malignant neoplasm of thyroid: Secondary | ICD-10-CM | POA: Diagnosis not present

## 2022-04-01 DIAGNOSIS — C61 Malignant neoplasm of prostate: Secondary | ICD-10-CM | POA: Diagnosis not present

## 2022-04-01 DIAGNOSIS — E119 Type 2 diabetes mellitus without complications: Secondary | ICD-10-CM | POA: Diagnosis not present

## 2022-04-01 DIAGNOSIS — I10 Essential (primary) hypertension: Secondary | ICD-10-CM | POA: Diagnosis not present

## 2022-04-01 DIAGNOSIS — Z85118 Personal history of other malignant neoplasm of bronchus and lung: Secondary | ICD-10-CM | POA: Diagnosis not present

## 2022-04-01 DIAGNOSIS — Z0001 Encounter for general adult medical examination with abnormal findings: Secondary | ICD-10-CM | POA: Diagnosis not present

## 2022-04-06 ENCOUNTER — Other Ambulatory Visit (HOSPITAL_COMMUNITY): Payer: Self-pay

## 2022-04-06 DIAGNOSIS — E786 Lipoprotein deficiency: Secondary | ICD-10-CM | POA: Diagnosis not present

## 2022-04-06 DIAGNOSIS — I10 Essential (primary) hypertension: Secondary | ICD-10-CM | POA: Diagnosis not present

## 2022-04-06 DIAGNOSIS — E119 Type 2 diabetes mellitus without complications: Secondary | ICD-10-CM | POA: Diagnosis not present

## 2022-04-06 DIAGNOSIS — Z0000 Encounter for general adult medical examination without abnormal findings: Secondary | ICD-10-CM | POA: Diagnosis not present

## 2022-04-06 MED ORDER — OZEMPIC (1 MG/DOSE) 4 MG/3ML ~~LOC~~ SOPN
PEN_INJECTOR | SUBCUTANEOUS | 1 refills | Status: DC
  Filled 2022-04-06: qty 6, 56d supply, fill #0
  Filled 2022-06-24: qty 6, 56d supply, fill #1

## 2022-05-07 ENCOUNTER — Other Ambulatory Visit (HOSPITAL_COMMUNITY): Payer: Self-pay

## 2022-05-07 MED ORDER — AMLODIPINE BESYLATE 5 MG PO TABS
5.0000 mg | ORAL_TABLET | Freq: Every day | ORAL | 0 refills | Status: DC
  Filled 2022-05-07: qty 90, 90d supply, fill #0

## 2022-05-28 ENCOUNTER — Other Ambulatory Visit (HOSPITAL_COMMUNITY): Payer: Self-pay

## 2022-05-28 MED ORDER — GLIPIZIDE ER 5 MG PO TB24
5.0000 mg | ORAL_TABLET | Freq: Two times a day (BID) | ORAL | 3 refills | Status: DC
  Filled 2022-05-28: qty 180, 90d supply, fill #0
  Filled 2022-08-25: qty 180, 90d supply, fill #1
  Filled 2022-11-22: qty 180, 90d supply, fill #2
  Filled 2023-02-23: qty 180, 90d supply, fill #3

## 2022-06-24 ENCOUNTER — Other Ambulatory Visit (HOSPITAL_COMMUNITY): Payer: Self-pay

## 2022-06-24 MED ORDER — DORZOLAMIDE HCL-TIMOLOL MAL 2-0.5 % OP SOLN
OPHTHALMIC | 3 refills | Status: AC
Start: 2022-06-24 — End: ?
  Filled 2022-06-24: qty 10, 40d supply, fill #0
  Filled 2022-08-08: qty 10, 40d supply, fill #1
  Filled 2022-09-16: qty 10, 50d supply, fill #2
  Filled 2022-10-02 – 2022-10-18 (×2): qty 10, 50d supply, fill #3
  Filled 2022-12-15: qty 10, 50d supply, fill #4
  Filled 2023-02-01: qty 10, 50d supply, fill #5

## 2022-06-24 MED ORDER — LISINOPRIL 2.5 MG PO TABS
2.5000 mg | ORAL_TABLET | Freq: Every day | ORAL | 0 refills | Status: DC
  Filled 2022-06-24: qty 90, 90d supply, fill #0

## 2022-07-02 DIAGNOSIS — E119 Type 2 diabetes mellitus without complications: Secondary | ICD-10-CM | POA: Diagnosis not present

## 2022-08-10 ENCOUNTER — Other Ambulatory Visit (HOSPITAL_COMMUNITY): Payer: Self-pay

## 2022-08-10 MED ORDER — OZEMPIC (1 MG/DOSE) 4 MG/3ML ~~LOC~~ SOPN
PEN_INJECTOR | SUBCUTANEOUS | 1 refills | Status: DC
  Filled 2022-08-10: qty 6, 56d supply, fill #0
  Filled 2022-10-01: qty 6, 56d supply, fill #1

## 2022-08-11 ENCOUNTER — Other Ambulatory Visit (HOSPITAL_COMMUNITY): Payer: Self-pay

## 2022-08-11 MED ORDER — LUMIGAN 0.01 % OP SOLN
OPHTHALMIC | 3 refills | Status: DC
  Filled 2022-08-11: qty 2.5, 30d supply, fill #0
  Filled 2022-08-12: qty 5, 60d supply, fill #0
  Filled 2022-08-13 – 2022-09-10 (×2): qty 2.5, 25d supply, fill #1
  Filled 2022-10-01: qty 2.5, 25d supply, fill #2
  Filled 2022-10-02 – 2022-11-02 (×2): qty 2.5, 25d supply, fill #3
  Filled 2022-12-02: qty 2.5, 25d supply, fill #4
  Filled 2022-12-28: qty 2.5, 25d supply, fill #5
  Filled 2023-02-01: qty 2.5, 25d supply, fill #6
  Filled 2023-02-26: qty 2.5, 25d supply, fill #7
  Filled 2023-05-11: qty 2.5, 25d supply, fill #8
  Filled 2023-06-28: qty 2.5, 25d supply, fill #9
  Filled 2023-07-25: qty 2.5, 25d supply, fill #10

## 2022-08-12 ENCOUNTER — Other Ambulatory Visit (HOSPITAL_COMMUNITY): Payer: Self-pay

## 2022-08-12 MED ORDER — AMLODIPINE BESYLATE 5 MG PO TABS
5.0000 mg | ORAL_TABLET | Freq: Every day | ORAL | 0 refills | Status: DC
  Filled 2022-08-12: qty 90, 90d supply, fill #0

## 2022-08-13 ENCOUNTER — Other Ambulatory Visit (HOSPITAL_COMMUNITY): Payer: Self-pay

## 2022-08-20 DIAGNOSIS — Z85828 Personal history of other malignant neoplasm of skin: Secondary | ICD-10-CM | POA: Diagnosis not present

## 2022-08-20 DIAGNOSIS — L821 Other seborrheic keratosis: Secondary | ICD-10-CM | POA: Diagnosis not present

## 2022-08-25 ENCOUNTER — Other Ambulatory Visit (HOSPITAL_COMMUNITY): Payer: Self-pay

## 2022-09-01 DIAGNOSIS — H401131 Primary open-angle glaucoma, bilateral, mild stage: Secondary | ICD-10-CM | POA: Diagnosis not present

## 2022-09-01 DIAGNOSIS — E119 Type 2 diabetes mellitus without complications: Secondary | ICD-10-CM | POA: Diagnosis not present

## 2022-09-05 ENCOUNTER — Other Ambulatory Visit (HOSPITAL_COMMUNITY): Payer: Self-pay

## 2022-09-10 ENCOUNTER — Other Ambulatory Visit (HOSPITAL_COMMUNITY): Payer: Self-pay

## 2022-09-11 ENCOUNTER — Other Ambulatory Visit (HOSPITAL_COMMUNITY): Payer: Self-pay

## 2022-09-16 ENCOUNTER — Other Ambulatory Visit (HOSPITAL_COMMUNITY): Payer: Self-pay

## 2022-09-16 MED ORDER — LISINOPRIL 2.5 MG PO TABS
2.5000 mg | ORAL_TABLET | Freq: Every day | ORAL | 0 refills | Status: DC
  Filled 2022-09-16: qty 90, 90d supply, fill #0

## 2022-09-18 DIAGNOSIS — Z86010 Personal history of colonic polyps: Secondary | ICD-10-CM | POA: Diagnosis not present

## 2022-09-18 DIAGNOSIS — K5901 Slow transit constipation: Secondary | ICD-10-CM | POA: Diagnosis not present

## 2022-10-01 ENCOUNTER — Other Ambulatory Visit (HOSPITAL_COMMUNITY): Payer: Self-pay

## 2022-10-02 ENCOUNTER — Other Ambulatory Visit (HOSPITAL_COMMUNITY): Payer: Self-pay

## 2022-10-19 ENCOUNTER — Other Ambulatory Visit (HOSPITAL_COMMUNITY): Payer: Self-pay

## 2022-10-19 MED ORDER — PEG 3350-KCL-NA BICARB-NACL 420 G PO SOLR
ORAL | 0 refills | Status: AC
  Filled 2022-10-19: qty 4000, 1d supply, fill #0

## 2022-10-26 ENCOUNTER — Other Ambulatory Visit (HOSPITAL_COMMUNITY): Payer: Self-pay

## 2022-10-27 DIAGNOSIS — Z83719 Family history of colon polyps, unspecified: Secondary | ICD-10-CM | POA: Diagnosis not present

## 2022-10-27 DIAGNOSIS — K649 Unspecified hemorrhoids: Secondary | ICD-10-CM | POA: Diagnosis not present

## 2022-10-27 DIAGNOSIS — Z86010 Personal history of colonic polyps: Secondary | ICD-10-CM | POA: Diagnosis not present

## 2022-11-02 ENCOUNTER — Other Ambulatory Visit (HOSPITAL_COMMUNITY): Payer: Self-pay

## 2022-11-09 ENCOUNTER — Other Ambulatory Visit (HOSPITAL_COMMUNITY): Payer: Self-pay

## 2022-11-09 MED ORDER — OZEMPIC (1 MG/DOSE) 4 MG/3ML ~~LOC~~ SOPN
1.0000 mg | PEN_INJECTOR | SUBCUTANEOUS | 1 refills | Status: AC
  Filled 2022-11-09 – 2022-11-22 (×2): qty 3, 28d supply, fill #0
  Filled 2022-12-23: qty 3, 28d supply, fill #1
  Filled 2023-01-21: qty 3, 28d supply, fill #2

## 2022-11-09 MED ORDER — AMLODIPINE BESYLATE 5 MG PO TABS
5.0000 mg | ORAL_TABLET | Freq: Every day | ORAL | 0 refills | Status: DC
  Filled 2022-11-09: qty 90, 90d supply, fill #0

## 2022-11-10 DIAGNOSIS — Z85828 Personal history of other malignant neoplasm of skin: Secondary | ICD-10-CM | POA: Diagnosis not present

## 2022-11-10 DIAGNOSIS — L570 Actinic keratosis: Secondary | ICD-10-CM | POA: Diagnosis not present

## 2022-11-10 DIAGNOSIS — D2371 Other benign neoplasm of skin of right lower limb, including hip: Secondary | ICD-10-CM | POA: Diagnosis not present

## 2022-11-10 DIAGNOSIS — L821 Other seborrheic keratosis: Secondary | ICD-10-CM | POA: Diagnosis not present

## 2022-11-10 DIAGNOSIS — L812 Freckles: Secondary | ICD-10-CM | POA: Diagnosis not present

## 2022-11-10 DIAGNOSIS — D225 Melanocytic nevi of trunk: Secondary | ICD-10-CM | POA: Diagnosis not present

## 2022-11-10 DIAGNOSIS — D1801 Hemangioma of skin and subcutaneous tissue: Secondary | ICD-10-CM | POA: Diagnosis not present

## 2022-11-23 ENCOUNTER — Other Ambulatory Visit (HOSPITAL_COMMUNITY): Payer: Self-pay

## 2022-12-03 ENCOUNTER — Other Ambulatory Visit (HOSPITAL_COMMUNITY): Payer: Self-pay

## 2022-12-03 DIAGNOSIS — N400 Enlarged prostate without lower urinary tract symptoms: Secondary | ICD-10-CM | POA: Diagnosis not present

## 2022-12-08 ENCOUNTER — Other Ambulatory Visit (HOSPITAL_COMMUNITY): Payer: Self-pay

## 2022-12-10 DIAGNOSIS — R350 Frequency of micturition: Secondary | ICD-10-CM | POA: Diagnosis not present

## 2022-12-10 DIAGNOSIS — N401 Enlarged prostate with lower urinary tract symptoms: Secondary | ICD-10-CM | POA: Diagnosis not present

## 2022-12-10 DIAGNOSIS — Z8546 Personal history of malignant neoplasm of prostate: Secondary | ICD-10-CM | POA: Diagnosis not present

## 2022-12-16 ENCOUNTER — Other Ambulatory Visit (HOSPITAL_COMMUNITY): Payer: Self-pay

## 2022-12-17 ENCOUNTER — Other Ambulatory Visit (HOSPITAL_COMMUNITY): Payer: Self-pay

## 2022-12-17 MED ORDER — LISINOPRIL 2.5 MG PO TABS
ORAL_TABLET | ORAL | 0 refills | Status: DC
  Filled 2022-12-17: qty 90, 90d supply, fill #0

## 2022-12-29 ENCOUNTER — Other Ambulatory Visit (HOSPITAL_COMMUNITY): Payer: Self-pay

## 2023-01-06 ENCOUNTER — Other Ambulatory Visit (HOSPITAL_COMMUNITY): Payer: Self-pay

## 2023-01-22 ENCOUNTER — Other Ambulatory Visit (HOSPITAL_COMMUNITY): Payer: Self-pay

## 2023-02-01 ENCOUNTER — Other Ambulatory Visit (HOSPITAL_COMMUNITY): Payer: Self-pay

## 2023-02-01 MED ORDER — OZEMPIC (1 MG/DOSE) 4 MG/3ML ~~LOC~~ SOPN
1.0000 mg | PEN_INJECTOR | SUBCUTANEOUS | 1 refills | Status: AC
  Filled 2023-02-01 – 2023-02-13 (×2): qty 9, 84d supply, fill #0
  Filled 2023-05-11: qty 9, 84d supply, fill #1

## 2023-02-03 ENCOUNTER — Other Ambulatory Visit (HOSPITAL_COMMUNITY): Payer: Self-pay

## 2023-02-04 ENCOUNTER — Other Ambulatory Visit (HOSPITAL_COMMUNITY): Payer: Self-pay

## 2023-02-12 ENCOUNTER — Other Ambulatory Visit (HOSPITAL_COMMUNITY): Payer: Self-pay

## 2023-02-13 ENCOUNTER — Other Ambulatory Visit (HOSPITAL_COMMUNITY): Payer: Self-pay

## 2023-02-23 ENCOUNTER — Other Ambulatory Visit (HOSPITAL_COMMUNITY): Payer: Self-pay

## 2023-02-24 ENCOUNTER — Other Ambulatory Visit (HOSPITAL_COMMUNITY): Payer: Self-pay

## 2023-02-24 MED ORDER — AMLODIPINE BESYLATE 5 MG PO TABS
5.0000 mg | ORAL_TABLET | Freq: Every day | ORAL | 0 refills | Status: DC
  Filled 2023-02-24: qty 90, 90d supply, fill #0

## 2023-02-26 ENCOUNTER — Other Ambulatory Visit (HOSPITAL_COMMUNITY): Payer: Self-pay

## 2023-02-27 ENCOUNTER — Other Ambulatory Visit (HOSPITAL_COMMUNITY): Payer: Self-pay

## 2023-03-01 ENCOUNTER — Other Ambulatory Visit (HOSPITAL_COMMUNITY): Payer: Self-pay

## 2023-03-03 ENCOUNTER — Other Ambulatory Visit (HOSPITAL_COMMUNITY): Payer: Self-pay

## 2023-03-06 ENCOUNTER — Other Ambulatory Visit (HOSPITAL_COMMUNITY): Payer: Self-pay

## 2023-03-08 ENCOUNTER — Other Ambulatory Visit (HOSPITAL_COMMUNITY): Payer: Self-pay

## 2023-03-08 MED ORDER — DORZOLAMIDE HCL-TIMOLOL MAL 2-0.5 % OP SOLN
1.0000 [drp] | Freq: Two times a day (BID) | OPHTHALMIC | 4 refills | Status: DC
  Filled 2023-03-08: qty 10, 50d supply, fill #0
  Filled 2023-05-11: qty 10, 50d supply, fill #1
  Filled 2023-06-28: qty 10, 50d supply, fill #2
  Filled 2023-08-19 – 2023-08-20 (×2): qty 10, 50d supply, fill #3
  Filled 2023-09-29: qty 10, 50d supply, fill #4
  Filled 2023-11-18: qty 10, 50d supply, fill #5
  Filled 2023-12-29: qty 10, 50d supply, fill #6
  Filled 2024-03-03: qty 10, 50d supply, fill #7

## 2023-03-12 ENCOUNTER — Other Ambulatory Visit (HOSPITAL_COMMUNITY): Payer: Self-pay

## 2023-03-15 ENCOUNTER — Other Ambulatory Visit: Payer: Self-pay

## 2023-03-15 ENCOUNTER — Other Ambulatory Visit (HOSPITAL_COMMUNITY): Payer: Self-pay

## 2023-03-27 ENCOUNTER — Other Ambulatory Visit (HOSPITAL_COMMUNITY): Payer: Self-pay

## 2023-03-29 ENCOUNTER — Other Ambulatory Visit (HOSPITAL_COMMUNITY): Payer: Self-pay

## 2023-03-29 MED ORDER — LISINOPRIL 2.5 MG PO TABS
2.5000 mg | ORAL_TABLET | Freq: Every day | ORAL | 0 refills | Status: DC
  Filled 2023-03-29: qty 30, 30d supply, fill #0

## 2023-04-05 ENCOUNTER — Other Ambulatory Visit: Payer: Self-pay

## 2023-04-15 ENCOUNTER — Other Ambulatory Visit: Payer: Self-pay

## 2023-04-21 ENCOUNTER — Other Ambulatory Visit (HOSPITAL_COMMUNITY): Payer: Self-pay

## 2023-04-23 ENCOUNTER — Other Ambulatory Visit: Payer: Self-pay

## 2023-04-29 ENCOUNTER — Other Ambulatory Visit (HOSPITAL_COMMUNITY): Payer: Self-pay

## 2023-04-30 ENCOUNTER — Other Ambulatory Visit (HOSPITAL_COMMUNITY): Payer: Self-pay

## 2023-04-30 MED ORDER — LISINOPRIL 2.5 MG PO TABS
2.5000 mg | ORAL_TABLET | Freq: Every day | ORAL | 0 refills | Status: AC
  Filled 2023-04-30 – 2023-05-11 (×2): qty 15, 15d supply, fill #0

## 2023-04-30 MED ORDER — LISINOPRIL 2.5 MG PO TABS
2.5000 mg | ORAL_TABLET | Freq: Every day | ORAL | 0 refills | Status: AC
  Filled 2023-04-30: qty 15, 15d supply, fill #0

## 2023-05-03 ENCOUNTER — Other Ambulatory Visit (HOSPITAL_COMMUNITY): Payer: Self-pay

## 2023-05-03 MED ORDER — AMLODIPINE BESYLATE 5 MG PO TABS
5.0000 mg | ORAL_TABLET | Freq: Every day | ORAL | 3 refills | Status: AC
  Filled 2023-05-24: qty 90, 90d supply, fill #0
  Filled 2024-02-06: qty 90, 90d supply, fill #1
  Filled 2024-05-01: qty 90, 90d supply, fill #2

## 2023-05-03 MED ORDER — LISINOPRIL 2.5 MG PO TABS
2.5000 mg | ORAL_TABLET | Freq: Every day | ORAL | 3 refills | Status: AC
  Filled 2023-05-03 – 2023-05-24 (×2): qty 90, 90d supply, fill #0
  Filled 2023-08-26: qty 90, 90d supply, fill #1
  Filled 2023-11-18: qty 90, 90d supply, fill #2
  Filled 2024-02-06: qty 90, 90d supply, fill #3

## 2023-05-03 MED ORDER — GLIPIZIDE ER 5 MG PO TB24
5.0000 mg | ORAL_TABLET | Freq: Two times a day (BID) | ORAL | 3 refills | Status: DC
  Filled 2023-05-24: qty 180, 90d supply, fill #0
  Filled 2023-08-19 – 2023-08-20 (×2): qty 180, 90d supply, fill #1
  Filled 2023-11-09: qty 180, 90d supply, fill #2
  Filled 2024-02-06: qty 180, 90d supply, fill #3

## 2023-05-08 ENCOUNTER — Other Ambulatory Visit (HOSPITAL_COMMUNITY): Payer: Self-pay

## 2023-05-11 ENCOUNTER — Other Ambulatory Visit (HOSPITAL_COMMUNITY): Payer: Self-pay

## 2023-05-11 ENCOUNTER — Other Ambulatory Visit: Payer: Self-pay

## 2023-05-12 ENCOUNTER — Other Ambulatory Visit (HOSPITAL_COMMUNITY): Payer: Self-pay

## 2023-05-13 ENCOUNTER — Other Ambulatory Visit (HOSPITAL_COMMUNITY): Payer: Self-pay

## 2023-05-14 ENCOUNTER — Other Ambulatory Visit: Payer: Self-pay

## 2023-05-14 ENCOUNTER — Other Ambulatory Visit (HOSPITAL_COMMUNITY): Payer: Self-pay

## 2023-05-14 MED ORDER — OZEMPIC (1 MG/DOSE) 4 MG/3ML ~~LOC~~ SOPN: PEN_INJECTOR | SUBCUTANEOUS | 1 refills | Status: AC

## 2023-05-14 MED ORDER — OZEMPIC (1 MG/DOSE) 4 MG/3ML ~~LOC~~ SOPN
PEN_INJECTOR | SUBCUTANEOUS | 1 refills | Status: AC
  Filled 2023-05-14: qty 9, 84d supply, fill #0
  Filled 2023-08-19: qty 9, 84d supply, fill #1

## 2023-05-24 ENCOUNTER — Other Ambulatory Visit (HOSPITAL_COMMUNITY): Payer: Self-pay

## 2023-05-31 ENCOUNTER — Other Ambulatory Visit (HOSPITAL_COMMUNITY): Payer: Self-pay

## 2023-06-28 ENCOUNTER — Other Ambulatory Visit (HOSPITAL_COMMUNITY): Payer: Self-pay

## 2023-08-19 ENCOUNTER — Other Ambulatory Visit (HOSPITAL_COMMUNITY): Payer: Self-pay

## 2023-08-19 MED ORDER — OZEMPIC (1 MG/DOSE) 4 MG/3ML ~~LOC~~ SOPN
1.0000 mg | PEN_INJECTOR | SUBCUTANEOUS | 1 refills | Status: DC
  Filled 2023-08-19: qty 9, 84d supply, fill #0
  Filled 2023-12-22 – 2023-12-23 (×2): qty 9, 84d supply, fill #1

## 2023-08-19 MED ORDER — METFORMIN HCL ER 500 MG PO TB24
500.0000 mg | ORAL_TABLET | Freq: Every day | ORAL | 3 refills | Status: DC
  Filled 2023-08-19: qty 90, 90d supply, fill #0
  Filled 2023-11-09: qty 90, 90d supply, fill #1
  Filled 2024-02-06: qty 90, 90d supply, fill #2
  Filled 2024-05-01: qty 90, 90d supply, fill #3

## 2023-08-20 ENCOUNTER — Other Ambulatory Visit (HOSPITAL_COMMUNITY): Payer: Self-pay

## 2023-08-24 ENCOUNTER — Other Ambulatory Visit: Payer: Self-pay

## 2023-08-24 ENCOUNTER — Other Ambulatory Visit (HOSPITAL_COMMUNITY): Payer: Self-pay

## 2023-08-24 MED ORDER — LUMIGAN 0.01 % OP SOLN
OPHTHALMIC | 3 refills | Status: DC
  Filled 2023-08-24: qty 7.5, 90d supply, fill #0
  Filled 2023-08-27: qty 2.5, 18d supply, fill #0
  Filled 2024-03-03: qty 7.5, 75d supply, fill #0
  Filled 2024-05-01: qty 7.5, 75d supply, fill #1
  Filled 2024-07-20: qty 7.5, 75d supply, fill #2

## 2023-08-24 MED ORDER — DORZOLAMIDE HCL-TIMOLOL MAL 2-0.5 % OP SOLN: 1.0000 [drp] | Freq: Two times a day (BID) | OPHTHALMIC | 3 refills | Status: DC

## 2023-08-25 ENCOUNTER — Other Ambulatory Visit (HOSPITAL_COMMUNITY): Payer: Self-pay

## 2023-08-26 ENCOUNTER — Other Ambulatory Visit (HOSPITAL_COMMUNITY): Payer: Self-pay

## 2023-08-27 ENCOUNTER — Other Ambulatory Visit (HOSPITAL_COMMUNITY): Payer: Self-pay

## 2023-09-29 ENCOUNTER — Other Ambulatory Visit (HOSPITAL_COMMUNITY): Payer: Self-pay

## 2023-09-30 ENCOUNTER — Other Ambulatory Visit (HOSPITAL_COMMUNITY): Payer: Self-pay

## 2023-11-10 ENCOUNTER — Other Ambulatory Visit (HOSPITAL_COMMUNITY): Payer: Self-pay

## 2023-11-20 ENCOUNTER — Other Ambulatory Visit (HOSPITAL_COMMUNITY): Payer: Self-pay

## 2023-12-22 ENCOUNTER — Other Ambulatory Visit (HOSPITAL_BASED_OUTPATIENT_CLINIC_OR_DEPARTMENT_OTHER): Payer: Self-pay

## 2023-12-23 ENCOUNTER — Other Ambulatory Visit (HOSPITAL_COMMUNITY): Payer: Self-pay

## 2023-12-30 ENCOUNTER — Other Ambulatory Visit (HOSPITAL_COMMUNITY): Payer: Self-pay

## 2024-01-26 ENCOUNTER — Emergency Department (HOSPITAL_BASED_OUTPATIENT_CLINIC_OR_DEPARTMENT_OTHER)
Admission: EM | Admit: 2024-01-26 | Discharge: 2024-01-26 | Disposition: A | Discharge status: Home / Self Care | Payer: Medicare HMO | Attending: Emergency Medicine | Admitting: Emergency Medicine

## 2024-01-26 ENCOUNTER — Encounter (HOSPITAL_BASED_OUTPATIENT_CLINIC_OR_DEPARTMENT_OTHER): Payer: Self-pay

## 2024-01-26 ENCOUNTER — Other Ambulatory Visit: Payer: Self-pay

## 2024-01-26 DIAGNOSIS — Z7984 Long term (current) use of oral hypoglycemic drugs: Secondary | ICD-10-CM | POA: Diagnosis not present

## 2024-01-26 DIAGNOSIS — R112 Nausea with vomiting, unspecified: Secondary | ICD-10-CM | POA: Diagnosis present

## 2024-01-26 DIAGNOSIS — R109 Unspecified abdominal pain: Secondary | ICD-10-CM | POA: Insufficient documentation

## 2024-01-26 DIAGNOSIS — E119 Type 2 diabetes mellitus without complications: Secondary | ICD-10-CM | POA: Insufficient documentation

## 2024-01-26 DIAGNOSIS — Z7982 Long term (current) use of aspirin: Secondary | ICD-10-CM | POA: Insufficient documentation

## 2024-01-26 LAB — COMPREHENSIVE METABOLIC PANEL
ALT: 18 U/L (ref 0–44)
AST: 36 U/L (ref 15–41)
Albumin: 4.1 g/dL (ref 3.5–5.0)
Alkaline Phosphatase: 57 U/L (ref 38–126)
Anion gap: 10 (ref 5–15)
BUN: 26 mg/dL — ABNORMAL HIGH (ref 8–23)
CO2: 25 mmol/L (ref 22–32)
Calcium: 9.6 mg/dL (ref 8.9–10.3)
Chloride: 99 mmol/L (ref 98–111)
Creatinine, Ser: 0.98 mg/dL (ref 0.61–1.24)
GFR, Estimated: >60 mL/min (ref >60)
Glucose, Bld: 267 mg/dL — ABNORMAL HIGH (ref 70–99)
Potassium: 6.1 mmol/L — ABNORMAL HIGH (ref 3.5–5.1)
Sodium: 134 mmol/L — ABNORMAL LOW (ref 135–145)
Total Bilirubin: 1.4 mg/dL — ABNORMAL HIGH (ref 0.0–1.2)
Total Protein: 7.1 g/dL (ref 6.5–8.1)

## 2024-01-26 LAB — CBC
HCT: 53.7 % — ABNORMAL HIGH (ref 39.0–52.0)
Hemoglobin: 19.3 g/dL — ABNORMAL HIGH (ref 13.0–17.0)
MCH: 29.5 pg (ref 26.0–34.0)
MCHC: 35.9 g/dL (ref 30.0–36.0)
MCV: 82.1 fL (ref 80.0–100.0)
Platelets: 254 10*3/uL (ref 150–400)
RBC: 6.54 MIL/uL — ABNORMAL HIGH (ref 4.22–5.81)
RDW: 13.4 % (ref 11.5–15.5)
WBC: 13.8 10*3/uL — ABNORMAL HIGH (ref 4.0–10.5)
nRBC: 0 % (ref 0.0–0.2)

## 2024-01-26 LAB — LIPASE, BLOOD: Lipase: 13 U/L (ref 11–51)

## 2024-01-26 LAB — POTASSIUM: Potassium: 3.9 mmol/L (ref 3.5–5.1)

## 2024-01-26 MED ORDER — DIPHENHYDRAMINE HCL 50 MG/ML IJ SOLN
25.0000 mg | Freq: Once | INTRAMUSCULAR | Status: AC
  Administered 2024-01-26: 25 mg via INTRAVENOUS
  Filled 2024-01-26: qty 1

## 2024-01-26 MED ORDER — HYOSCYAMINE SULFATE 0.125 MG SL SUBL
0.2500 mg | SUBLINGUAL_TABLET | Freq: Once | SUBLINGUAL | Status: AC
  Administered 2024-01-26: 0.25 mg via SUBLINGUAL
  Filled 2024-01-26: qty 2

## 2024-01-26 MED ORDER — ALUM & MAG HYDROXIDE-SIMETH 200-200-20 MG/5ML PO SUSP
30.0000 mL | Freq: Once | ORAL | Status: AC
  Administered 2024-01-26: 30 mL via ORAL
  Filled 2024-01-26: qty 30

## 2024-01-26 MED ORDER — PROMETHAZINE HCL 25 MG RE SUPP
25.0000 mg | Freq: Four times a day (QID) | RECTAL | 0 refills | Status: AC | PRN
Start: 2024-01-26 — End: ?

## 2024-01-26 MED ORDER — SODIUM CHLORIDE 0.9 % IV BOLUS
1000.0000 mL | Freq: Once | INTRAVENOUS | Status: AC
Start: 2024-01-26 — End: 2024-01-26
  Administered 2024-01-26: 1000 mL via INTRAVENOUS

## 2024-01-26 MED ORDER — METOCLOPRAMIDE HCL 5 MG/ML IJ SOLN
10.0000 mg | Freq: Once | INTRAMUSCULAR | Status: AC
  Administered 2024-01-26: 10 mg via INTRAVENOUS
  Filled 2024-01-26: qty 2

## 2024-01-26 MED ORDER — PROMETHAZINE HCL 25 MG PO TABS: 25.0000 mg | ORAL_TABLET | Freq: Four times a day (QID) | ORAL | 0 refills | Status: AC | PRN

## 2024-01-26 MED ORDER — ONDANSETRON HCL 4 MG/2ML IJ SOLN
4.0000 mg | Freq: Once | INTRAMUSCULAR | Status: AC
  Administered 2024-01-26: 4 mg via INTRAVENOUS
  Filled 2024-01-26: qty 2

## 2024-01-26 NOTE — ED Provider Notes (Signed)
 [place] EMERGENCY DEPARTMENT AT [facility] Provider Note   CSN: [id number] Arrival date & time: 01/26/24  0203     History  Chief Complaint  Patient presents with   Vomiting    Elijah Roberts is a 68 y.o. male.  68 yo M with a chief plaints of nausea and vomiting.  Going on for couple days now.  Has spasms of abdominal pain as well.  He is being treated for strep pharyngitis.  Said he presented with nausea and vomiting and abdominal discomfort and they did a throat swab and showed it was positive and started him on antibiotics.  He has had trouble tolerating the antibiotics at home.        Home Medications Prior to Admission medications   Medication Sig Start Date End Date Taking? Authorizing Provider  promethazine  (PHENERGAN ) 25 MG suppository Place 1 suppository (25 mg total) rectally every 6 (six) hours as needed for nausea or vomiting. 01/26/24  Yes Emil Share, DO  promethazine  (PHENERGAN ) 25 MG tablet Take 1 tablet (25 mg total) by mouth every 6 (six) hours as needed for nausea or vomiting. 01/26/24  Yes Emil Share, DO  amLODipine  (NORVASC ) 5 MG tablet Take 5 mg by mouth daily.    [provider]  amLODipine  (NORVASC ) 5 MG tablet Take 1 tablet (5 mg total) by mouth daily. 05/03/23     aspirin 81 MG tablet Take 81 mg by mouth daily.    [provider]  bimatoprost  (LUMIGAN ) 0.03 % ophthalmic solution Place 1 drop into both eyes at bedtime.     [provider]  calcium carbonate (TUMS - DOSED IN MG ELEMENTAL CALCIUM) 500 MG chewable tablet Chew 1 tablet by mouth as needed for indigestion or heartburn.    [provider]  cetirizine (ZYRTEC) 10 MG tablet Take 10 mg by mouth daily as needed for allergies.    [provider]  CHROMIUM PO Take by mouth daily.    [provider]  dorzolamide -timolol  (COSOPT ) 2-0.5 % ophthalmic solution Place 1 drop into both eyes 2 (two) times daily. 03/08/23     dorzolamide -timolol   (COSOPT ) 2-0.5 % ophthalmic solution Place 1 drop into both eyes 2 (two) times daily. 08/24/23     dorzolamide -timolol  (COSOPT ) 22.3-6.8 MG/ML ophthalmic solution Place 1 drop into both eyes 2 (two) times daily.     [provider]  dorzolamide -timolol  (COSOPT ) 2-0.5 % ophthalmic solution INSTILL 1 DROP INTO EACH EYE TWICE DAILY 06/24/22     EPIPEN 2-PAK 0.3 MG/0.3ML SOAJ injection  09/17/15   [provider]  glipiZIDE  (GLUCOTROL  XL) 5 MG 24 hr tablet TAKE 1 TABLET BY MOUTH TWICE DAILY 03/04/21 03/04/22  Debrah Josette ORN., PA-C  glipiZIDE  (GLUCOTROL  XL) 5 MG 24 hr tablet TAKE 1 TABLET BY MOUTH TWICE DAILY 03/31/21     glipiZIDE  (GLUCOTROL  XL) 5 MG 24 hr tablet Take 1 tablet (5 mg total) by mouth 2 (two) times daily. 05/03/23     HYDROcodone -acetaminophen  (NORCO) 7.5-325 MG tablet Take 1-2 tablets by mouth every 4 (four) hours as needed for moderate pain. Maximum dose per 24 hours - 8 pills 01/17/16   Ottelin, Mark, MD  ibuprofen (ADVIL,MOTRIN) 200 MG tablet Take 200 mg by mouth every 6 (six) hours as needed.    [provider]  lisinopril  (ZESTRIL ) 2.5 MG tablet TAKE 1 TABLET BY MOUTH ONCE DAILY AS DIRECTED 02/13/20 02/12/21  Debrah Josette ORN., PA-C  lisinopril  (ZESTRIL ) 2.5 MG tablet TAKE 1 TABLET BY  MOUTH DAILY AS DIRECTED 03/31/21     lisinopril  (ZESTRIL ) 2.5 MG tablet Take 1 tablet (2.5 mg total) by mouth daily. 04/30/23     lisinopril  (ZESTRIL ) 2.5 MG tablet Take 1 tablet (2.5 mg total) by mouth daily. 04/30/23     lisinopril  (ZESTRIL ) 2.5 MG tablet Take 1 tablet (2.5 mg total) by mouth daily. 05/03/23     LUMIGAN  0.01 % SOLN INSTILL 1 DROP IN BOTH EYES EACH NIGHT AT BEDTIME 06/17/21     LUMIGAN  0.01 % SOLN INSTILL 1 DROP IN BOTH EYES EACH NIGHT AT BEDTIME 08/24/23     metFORMIN  (GLUCOPHAGE -XR) 500 MG 24 hr tablet  12/09/15   [provider]  metFORMIN  (GLUCOPHAGE -XR) 500 MG 24 hr tablet TAKE 2 TABLETS BY MOUTH TWICE DAILY (MUST HAVE OFFICE VISIT) 02/13/20 02/12/21  Debrah Josette ORN., PA-C  metFORMIN  (GLUCOPHAGE -XR) 500 MG 24 hr tablet Take 1 tablet (500 mg total) by mouth daily. 08/19/23     Multiple Vitamin (MULTIVITAMIN) tablet Take 1 tablet by mouth daily.    [provider]  mupirocin  ointment (BACTROBAN ) 2 % Apply 1 application on the skin daily as directed 07/31/21     polyethylene glycol-electrolytes (NULYTELY) 420 g solution Use as directed per package instructions 09/18/22   Rosalie Kitchens, MD  Semaglutide , 1 MG/DOSE, (OZEMPIC , 1 MG/DOSE,) 4 MG/3ML SOPN Inject 1 mg into the skin once a week. 11/09/22     Semaglutide , 1 MG/DOSE, (OZEMPIC , 1 MG/DOSE,) 4 MG/3ML SOPN Inject 1 mg into the skin once a week. 01/31/23     Semaglutide , 1 MG/DOSE, (OZEMPIC , 1 MG/DOSE,) 4 MG/3ML SOPN Inject 0.75ml (1mg  total) into the skin every 7 days 05/14/23     Semaglutide , 1 MG/DOSE, (OZEMPIC , 1 MG/DOSE,) 4 MG/3ML SOPN Inject 0.75ml (1mg  total) into the skin every 7 days 05/14/23     Semaglutide , 1 MG/DOSE, (OZEMPIC , 1 MG/DOSE,) 4 MG/3ML SOPN Inject 1 mg into the skin once a week. (every 7 days) 08/19/23     Semaglutide ,0.25 or 0.5MG /DOS, (OZEMPIC , 0.25 OR 0.5 MG/DOSE,) 2 MG/3ML SOPN Inject 0.5mg  under the skin once a week 04/20/21   Debrah Josette ORN., PA-C  tamsulosin (FLOMAX) 0.4 MG CAPS capsule Take 0.4 mg by mouth.    [provider]      Allergies    Bee venom, Altace [ramipril], Other, and Tetanus toxoids    Review of Systems   Review of Systems  Physical Exam Updated Vital Signs BP (!) 160/93   Pulse (!) 107   Temp 97.6 F (36.4 C) (Oral)   Resp 18   Ht 6' 1 (1.854 m)   Wt 97.5 kg   SpO2 98%   BMI 28.37 kg/m  Physical Exam Vitals and nursing note reviewed.  Constitutional:      Appearance: He is well-developed.  HENT:     Head: Normocephalic and atraumatic.     Comments: Swollen turbinates, posterior nasal drip, no noted sinus ttp, tm normal bilaterally.   Eyes:     Pupils: Pupils are equal, round, and reactive to light.  Neck:      Vascular: No JVD.  Cardiovascular:     Rate and Rhythm: Normal rate and regular rhythm.     Heart sounds: No murmur heard.    No friction rub. No gallop.  Pulmonary:     Effort: No respiratory distress.     Breath sounds: No wheezing.  Abdominal:     General: There is no distension.     Tenderness: There is  no abdominal tenderness. There is no guarding or rebound.  Musculoskeletal:        General: Normal range of motion.     Cervical back: Normal range of motion and neck supple.  Skin:    Coloration: Skin is not pale.     Findings: No rash.  Neurological:     Mental Status: He is alert and oriented to person, place, and time.  Psychiatric:        Behavior: Behavior normal.     ED Results / Procedures / Treatments   Labs (all labs ordered are listed, but only abnormal results are displayed) Labs Reviewed  COMPREHENSIVE METABOLIC PANEL - Abnormal; Notable for the following components:      Result Value   Sodium 134 (*)    Potassium 6.1 (*)    Glucose, Bld 267 (*)    BUN 26 (*)    Total Bilirubin 1.4 (*)    All other components within normal limits  CBC - Abnormal; Notable for the following components:   WBC 13.8 (*)    RBC 6.54 (*)    Hemoglobin 19.3 (*)    HCT 53.7 (*)    All other components within normal limits  LIPASE, BLOOD  POTASSIUM  URINALYSIS, ROUTINE W REFLEX MICROSCOPIC    EKG EKG Interpretation Date/Time:  Wednesday January 26 2024 05:38:47 EST Ventricular Rate:  108 PR Interval:  142 QRS Duration:  104 QT Interval:  361 QTC Calculation: 484 R Axis:   7  Text Interpretation: Sinus tachycardia Multiform ventricular premature complexes Probable left atrial enlargement Borderline prolonged QT interval No significant change since last tracing Confirmed by Emil Share [phone]) on 01/26/2024 5:52:50 AM  Radiology No results found.  Procedures Procedures    Medications Ordered in ED Medications  ondansetron  (ZOFRAN ) injection 4 mg (4 mg Intravenous  Given 01/26/24 0232)  sodium chloride  0.9 % bolus 1,000 mL (1,000 mLs Intravenous New Bag/Given 01/26/24 0527)  metoCLOPramide  (REGLAN ) injection 10 mg (10 mg Intravenous Given 01/26/24 0530)  diphenhydrAMINE  (BENADRYL ) injection 25 mg (25 mg Intravenous Given 01/26/24 0532)  alum & mag hydroxide-simeth (MAALOX/MYLANTA) 200-200-20 MG/5ML suspension 30 mL (30 mLs Oral Given 01/26/24 0532)  hyoscyamine  (LEVSIN  SL) SL tablet 0.25 mg (0.25 mg Sublingual Given 01/26/24 0532)    ED Course/ Medical Decision Making/ A&P                                 Medical Decision Making Amount and/or Complexity of Data Reviewed Labs: ordered. ECG/medicine tests: ordered.  Risk OTC drugs. Prescription drug management.   68 yo M with a chief complaint of nausea vomiting and abdominal discomfort.  This been going on for about 48 hours now.  He was seen and started on antibiotics for strep throat.  Since then he has not been able to keep anything down.  Has tried Zofran  at home but without improvement.  Initial blood work with leukocytosis.  Potassium is high without a change in his renal function I suspect likely hemolysis.  Will recheck potassium.  EKG.  Bolus of IV fluids antiemetics reassess.  Patient is feeling a bit better on repeat assessments.  Would like to go home.  Will have him follow-up with his family doctor in the office.  6:31 AM:  I have discussed the diagnosis/risks/treatment options with the patient and family.  Evaluation and diagnostic testing in the emergency department does not suggest an emergent condition requiring  admission or immediate intervention beyond what has been performed at this time.  They will follow up with PCP. We also discussed returning to the ED immediately if new or worsening sx occur. We discussed the sx which are most concerning (e.g., sudden worsening pain, fever, inability to tolerate by mouth) that necessitate immediate return. Medications administered to the patient during  their visit and any new prescriptions provided to the patient are listed below.  Medications given during this visit Medications  ondansetron  (ZOFRAN ) injection 4 mg (4 mg Intravenous Given 01/26/24 0232)  sodium chloride  0.9 % bolus 1,000 mL (1,000 mLs Intravenous New Bag/Given 01/26/24 0527)  metoCLOPramide  (REGLAN ) injection 10 mg (10 mg Intravenous Given 01/26/24 0530)  diphenhydrAMINE  (BENADRYL ) injection 25 mg (25 mg Intravenous Given 01/26/24 0532)  alum & mag hydroxide-simeth (MAALOX/MYLANTA) 200-200-20 MG/5ML suspension 30 mL (30 mLs Oral Given 01/26/24 0532)  hyoscyamine  (LEVSIN  SL) SL tablet 0.25 mg (0.25 mg Sublingual Given 01/26/24 0532)     The patient appears reasonably screen and/or stabilized for discharge and I doubt any other medical condition or other [facility] requiring further screening, evaluation, or treatment in the ED at this time prior to discharge.          Final Clinical Impression(s) / ED Diagnoses Final diagnoses:  Nausea and vomiting in adult    Rx / DC Orders ED Discharge Orders          Ordered    promethazine  (PHENERGAN ) 25 MG tablet  Every 6 hours PRN        01/26/24 0630    promethazine  (PHENERGAN ) 25 MG suppository  Every 6 hours PRN        01/26/24 0630              Emil Share, DO 01/26/24 [phone]

## 2024-01-26 NOTE — ED Triage Notes (Signed)
 Pt states that he was diagnosed with strep yesterday at [facility], given antibiotics. Pt has been vomiting for the past 2-3 days, type 2 diabetic.

## 2024-01-26 NOTE — ED Notes (Signed)
 AVS provided by edp was reviewed with pt. Pt verbalized understanding with no additional questions at this time. Pt verified pharmacy. Pt going home with family at bedside

## 2024-01-26 NOTE — ED Notes (Signed)
 ED Provider at bedside.

## 2024-01-26 NOTE — Discharge Instructions (Signed)
 Please call the family doctor in the office.  Please return for worsening or uncontrolled abdominal pain fever or inability eat or drink.

## 2024-02-07 ENCOUNTER — Other Ambulatory Visit (HOSPITAL_COMMUNITY): Payer: Self-pay

## 2024-03-03 ENCOUNTER — Other Ambulatory Visit (HOSPITAL_COMMUNITY): Payer: Self-pay

## 2024-03-03 ENCOUNTER — Other Ambulatory Visit: Payer: Self-pay

## 2024-03-04 ENCOUNTER — Other Ambulatory Visit (HOSPITAL_COMMUNITY): Payer: Self-pay

## 2024-03-07 ENCOUNTER — Other Ambulatory Visit (HOSPITAL_COMMUNITY): Payer: Self-pay

## 2024-03-08 ENCOUNTER — Other Ambulatory Visit (HOSPITAL_COMMUNITY): Payer: Self-pay

## 2024-03-08 MED ORDER — LUMIGAN 0.01 % OP SOLN
1.0000 [drp] | Freq: Every day | OPHTHALMIC | 4 refills | Status: AC
  Filled 2024-03-08: qty 7.5, 150d supply, fill #0

## 2024-03-11 ENCOUNTER — Other Ambulatory Visit (HOSPITAL_COMMUNITY): Payer: Self-pay

## 2024-03-12 ENCOUNTER — Other Ambulatory Visit (HOSPITAL_COMMUNITY): Payer: Self-pay

## 2024-03-13 ENCOUNTER — Other Ambulatory Visit (HOSPITAL_COMMUNITY): Payer: Self-pay

## 2024-03-13 MED ORDER — SEMAGLUTIDE (1 MG/DOSE) 4 MG/3ML ~~LOC~~ SOPN
1.0000 mg | PEN_INJECTOR | SUBCUTANEOUS | 0 refills | Status: DC
  Filled 2024-03-13: qty 9, 84d supply, fill #0

## 2024-03-13 MED ORDER — DORZOLAMIDE HCL-TIMOLOL MAL 2-0.5 % OP SOLN
1.0000 [drp] | Freq: Two times a day (BID) | OPHTHALMIC | 4 refills | Status: AC
Start: 2024-03-13 — End: ?
  Filled 2024-03-13: qty 10, 50d supply, fill #0
  Filled 2024-05-01: qty 10, 50d supply, fill #1

## 2024-03-15 ENCOUNTER — Other Ambulatory Visit: Payer: Self-pay

## 2024-03-28 ENCOUNTER — Other Ambulatory Visit (HOSPITAL_COMMUNITY): Payer: Self-pay

## 2024-03-28 MED ORDER — LUMIGAN 0.01 % OP SOLN
1.0000 [drp] | Freq: Every day | OPHTHALMIC | 4 refills | Status: AC
Start: 2024-03-28 — End: ?
  Filled 2024-03-28: qty 7.5, 150d supply, fill #0

## 2024-03-28 MED ORDER — DORZOLAMIDE HCL-TIMOLOL MAL 2-0.5 % OP SOLN
1.0000 [drp] | Freq: Two times a day (BID) | OPHTHALMIC | 4 refills | Status: AC
  Filled 2024-03-28: qty 10, 100d supply, fill #0

## 2024-03-29 ENCOUNTER — Other Ambulatory Visit (HOSPITAL_COMMUNITY): Payer: Self-pay

## 2024-03-29 MED ORDER — OZEMPIC (1 MG/DOSE) 4 MG/3ML ~~LOC~~ SOPN
1.0000 mg | PEN_INJECTOR | SUBCUTANEOUS | 1 refills | Status: AC
  Filled 2024-03-29: qty 9, 84d supply, fill #0

## 2024-03-29 MED ORDER — GLIPIZIDE ER 5 MG PO TB24
5.0000 mg | ORAL_TABLET | Freq: Two times a day (BID) | ORAL | 3 refills | Status: AC
  Filled 2024-03-29: qty 140, 70d supply, fill #0
  Filled 2024-03-29: qty 40, 20d supply, fill #0
  Filled 2024-05-01: qty 180, 90d supply, fill #0
  Filled 2024-08-01: qty 180, 90d supply, fill #1
  Filled 2024-10-30: qty 180, 90d supply, fill #2

## 2024-03-29 MED ORDER — LISINOPRIL 2.5 MG PO TABS
2.5000 mg | ORAL_TABLET | Freq: Every day | ORAL | 3 refills | Status: AC
  Filled 2024-05-26: qty 90, 90d supply, fill #0
  Filled 2024-08-18: qty 90, 90d supply, fill #1
  Filled 2024-11-12: qty 90, 90d supply, fill #2

## 2024-03-29 MED ORDER — AMLODIPINE BESYLATE 5 MG PO TABS
5.0000 mg | ORAL_TABLET | Freq: Every day | ORAL | 3 refills | Status: AC
  Filled 2024-03-29 – 2024-10-17 (×2): qty 90, 90d supply, fill #0
  Filled 2025-01-12 (×2): qty 90, 90d supply, fill #1

## 2024-05-01 ENCOUNTER — Other Ambulatory Visit (HOSPITAL_COMMUNITY): Payer: Self-pay

## 2024-05-01 ENCOUNTER — Other Ambulatory Visit: Payer: Self-pay

## 2024-05-26 ENCOUNTER — Other Ambulatory Visit (HOSPITAL_COMMUNITY): Payer: Self-pay

## 2024-05-27 ENCOUNTER — Other Ambulatory Visit (HOSPITAL_COMMUNITY): Payer: Self-pay

## 2024-06-05 ENCOUNTER — Other Ambulatory Visit (HOSPITAL_COMMUNITY): Payer: Self-pay

## 2024-06-06 ENCOUNTER — Other Ambulatory Visit (HOSPITAL_COMMUNITY): Payer: Self-pay

## 2024-06-06 MED ORDER — SEMAGLUTIDE (1 MG/DOSE) 4 MG/3ML ~~LOC~~ SOPN
1.0000 mg | PEN_INJECTOR | SUBCUTANEOUS | 0 refills | Status: DC
  Filled 2024-06-06: qty 9, 84d supply, fill #0

## 2024-06-07 ENCOUNTER — Other Ambulatory Visit (HOSPITAL_COMMUNITY): Payer: Self-pay

## 2024-07-20 ENCOUNTER — Other Ambulatory Visit (HOSPITAL_COMMUNITY): Payer: Self-pay

## 2024-07-20 MED ORDER — METFORMIN HCL ER 500 MG PO TB24
500.0000 mg | ORAL_TABLET | Freq: Every day | ORAL | 0 refills | Status: DC
  Filled 2024-07-20: qty 90, 90d supply, fill #0

## 2024-07-24 ENCOUNTER — Other Ambulatory Visit (HOSPITAL_COMMUNITY): Payer: Self-pay

## 2024-07-24 ENCOUNTER — Other Ambulatory Visit: Payer: Self-pay

## 2024-07-24 MED ORDER — DORZOLAMIDE HCL-TIMOLOL MAL 2-0.5 % OP SOLN
1.0000 [drp] | Freq: Two times a day (BID) | OPHTHALMIC | 4 refills | Status: AC
  Filled 2024-07-24: qty 10, 50d supply, fill #0
  Filled 2024-10-09: qty 10, 50d supply, fill #1
  Filled 2024-11-19: qty 10, 50d supply, fill #2
  Filled 2025-01-10 – 2025-01-11 (×2): qty 10, 50d supply, fill #3

## 2024-08-18 ENCOUNTER — Other Ambulatory Visit (HOSPITAL_COMMUNITY): Payer: Self-pay

## 2024-08-28 ENCOUNTER — Other Ambulatory Visit (HOSPITAL_COMMUNITY): Payer: Self-pay

## 2024-08-28 MED ORDER — SEMAGLUTIDE (1 MG/DOSE) 4 MG/3ML ~~LOC~~ SOPN
1.0000 mg | PEN_INJECTOR | SUBCUTANEOUS | 0 refills | Status: DC
  Filled 2024-08-28: qty 9, 84d supply, fill #0

## 2024-08-30 ENCOUNTER — Other Ambulatory Visit (HOSPITAL_COMMUNITY): Payer: Self-pay

## 2024-10-11 ENCOUNTER — Other Ambulatory Visit (HOSPITAL_COMMUNITY): Payer: Self-pay

## 2024-10-11 MED ORDER — LUMIGAN 0.01 % OP SOLN
1.0000 [drp] | Freq: Every day | OPHTHALMIC | 3 refills | Status: AC
  Filled 2024-10-11: qty 2.5, 25d supply, fill #0
  Filled 2025-01-10 – 2025-01-11 (×2): qty 2.5, 25d supply, fill #1

## 2024-10-12 ENCOUNTER — Other Ambulatory Visit (HOSPITAL_COMMUNITY): Payer: Self-pay

## 2024-10-17 ENCOUNTER — Other Ambulatory Visit (HOSPITAL_COMMUNITY): Payer: Self-pay

## 2024-10-17 MED ORDER — METFORMIN HCL ER 500 MG PO TB24
500.0000 mg | ORAL_TABLET | Freq: Every day | ORAL | 0 refills | Status: DC
  Filled 2024-10-17: qty 90, 90d supply, fill #0

## 2024-10-18 ENCOUNTER — Other Ambulatory Visit (HOSPITAL_COMMUNITY): Payer: Self-pay

## 2024-10-18 ENCOUNTER — Other Ambulatory Visit: Payer: Self-pay

## 2024-10-18 MED ORDER — LUMIGAN 0.01 % OP SOLN
1.0000 [drp] | Freq: Every day | OPHTHALMIC | 4 refills | Status: AC
  Filled 2024-10-18: qty 2.5, 25d supply, fill #0

## 2024-11-12 ENCOUNTER — Other Ambulatory Visit (HOSPITAL_COMMUNITY): Payer: Self-pay

## 2024-11-13 ENCOUNTER — Other Ambulatory Visit (HOSPITAL_COMMUNITY): Payer: Self-pay

## 2024-11-13 MED ORDER — SEMAGLUTIDE (1 MG/DOSE) 4 MG/3ML ~~LOC~~ SOPN
1.0000 mg | PEN_INJECTOR | SUBCUTANEOUS | 0 refills | Status: AC
  Filled 2024-11-13: qty 9, 84d supply, fill #0

## 2024-11-14 ENCOUNTER — Other Ambulatory Visit (HOSPITAL_COMMUNITY): Payer: Self-pay

## 2025-01-11 ENCOUNTER — Other Ambulatory Visit (HOSPITAL_COMMUNITY): Payer: Self-pay

## 2025-01-11 ENCOUNTER — Other Ambulatory Visit: Payer: Self-pay

## 2025-01-12 ENCOUNTER — Other Ambulatory Visit: Payer: Self-pay

## 2025-01-12 ENCOUNTER — Other Ambulatory Visit (HOSPITAL_COMMUNITY): Payer: Self-pay

## 2025-01-12 MED ORDER — METFORMIN HCL ER 500 MG PO TB24
500.0000 mg | ORAL_TABLET | Freq: Every day | ORAL | 0 refills | Status: AC
  Filled 2025-01-12 (×2): qty 90, 90d supply, fill #0
